# Patient Record
Sex: Female | Born: 1995 | Race: White | Hispanic: No | Smoking: Never smoker
Health system: Southern US, Community
[De-identification: ages and names within clinical notes are randomized; demographics above are authoritative.]

## PROBLEM LIST (undated history)

## (undated) DIAGNOSIS — Z87442 Personal history of urinary calculi: Secondary | ICD-10-CM

## (undated) DIAGNOSIS — N200 Calculus of kidney: Secondary | ICD-10-CM

## (undated) DIAGNOSIS — Z87898 Personal history of other specified conditions: Secondary | ICD-10-CM

## (undated) DIAGNOSIS — E038 Other specified hypothyroidism: Secondary | ICD-10-CM

## (undated) DIAGNOSIS — R4589 Other symptoms and signs involving emotional state: Secondary | ICD-10-CM

## (undated) DIAGNOSIS — F418 Other specified anxiety disorders: Secondary | ICD-10-CM

## (undated) DIAGNOSIS — R0789 Other chest pain: Secondary | ICD-10-CM

## (undated) DIAGNOSIS — M419 Scoliosis, unspecified: Secondary | ICD-10-CM

## (undated) DIAGNOSIS — G43909 Migraine, unspecified, not intractable, without status migrainosus: Secondary | ICD-10-CM

## (undated) DIAGNOSIS — M1990 Unspecified osteoarthritis, unspecified site: Secondary | ICD-10-CM

## (undated) HISTORY — DX: Other specified hypothyroidism: E03.8

## (undated) HISTORY — PX: CYST REMOVAL NECK: SHX6281

## (undated) HISTORY — DX: Other chest pain: R07.89

## (undated) HISTORY — DX: Other specified anxiety disorders: F41.8

## (undated) HISTORY — DX: Migraine, unspecified, not intractable, without status migrainosus: G43.909

## (undated) HISTORY — DX: Other symptoms and signs involving emotional state: R45.89

## (undated) HISTORY — DX: Scoliosis, unspecified: M41.9

## (undated) HISTORY — PX: MOUTH SURGERY: SHX715

## (undated) HISTORY — DX: Unspecified osteoarthritis, unspecified site: M19.90

---
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
## 2016-01-21 ENCOUNTER — Encounter: Payer: Self-pay | Admitting: Family Medicine

## 2016-01-21 ENCOUNTER — Ambulatory Visit (INDEPENDENT_AMBULATORY_CARE_PROVIDER_SITE_OTHER): Payer: BLUE CROSS/BLUE SHIELD | Admitting: Family Medicine

## 2016-01-21 VITALS — BP 124/81 | HR 102 | Temp 99.1°F | Ht 62.0 in | Wt 121.6 lb

## 2016-01-21 DIAGNOSIS — G43809 Other migraine, not intractable, without status migrainosus: Secondary | ICD-10-CM | POA: Diagnosis not present

## 2016-01-21 DIAGNOSIS — J302 Other seasonal allergic rhinitis: Secondary | ICD-10-CM | POA: Insufficient documentation

## 2016-01-21 DIAGNOSIS — J069 Acute upper respiratory infection, unspecified: Secondary | ICD-10-CM

## 2016-01-21 DIAGNOSIS — G43909 Migraine, unspecified, not intractable, without status migrainosus: Secondary | ICD-10-CM | POA: Insufficient documentation

## 2016-01-21 MED ORDER — FLUTICASONE PROPIONATE 50 MCG/ACT NA SUSP: 1.0000 | Freq: Two times a day (BID) | NASAL | Status: DC | PRN

## 2016-01-21 MED ORDER — NAPROXEN 500 MG PO TABS: 500.0000 mg | ORAL_TABLET | Freq: Two times a day (BID) | ORAL | Status: DC

## 2016-01-21 MED ORDER — AMOXICILLIN 500 MG PO CAPS: 500.0000 mg | ORAL_CAPSULE | Freq: Two times a day (BID) | ORAL | Status: DC

## 2016-01-21 NOTE — Progress Notes (Signed)
BP 124/81 mmHg  Pulse 102  Temp(Src) 99.1 F (37.3 C) (Oral)  Ht  (1.575 m)  Wt 121 lb 9.6 oz (55.157 kg)  BMI 22.24 kg/m2  LMP 01/21/2016 (Exact Date)   Subjective:    Patient ID: Tami Santos, female    DOB: 1996/09/08, 20 y.o.   MRN: [id number]  HPI: Tami Santos is a 19 y.o. female presenting on 01/21/2016 for Bilateral ear pain x 1 week; Sinus Congestion; and Headache   HPI Sinus congestion and ear pain Patient has been having sinus congestion and ear pain for the past week. She also gets bilateral headache since been associated with it as well. The headaches start from her frontal region radiating around to her temples. She denies any fevers or chills. The ear pain has been alternating back and forth between right side and left side. She does admit to having postnasal drainage and a cough that is nonproductive. She is also been having some nasal drainage. She has chronic seasonal allergies and uses her Flonase for this every day. She denies any shortness of breath or wheezing. She is leaving tomorrow to remove the [employer] with her new husband.  Relevant past medical, surgical, family and social history reviewed and updated as indicated. Interim medical history since our last visit reviewed. Allergies and medications reviewed and updated.  Review of Systems  Constitutional: Negative for fever and chills.  HENT: Positive for ear pain, postnasal drip, rhinorrhea, sinus pressure, sneezing and sore throat. Negative for congestion and ear discharge.   Eyes: Negative for pain, redness and visual disturbance.  Respiratory: Positive for cough. Negative for chest tightness and shortness of breath.   Cardiovascular: Negative for chest pain and leg swelling.  Genitourinary: Negative for dysuria and difficulty urinating.  Musculoskeletal: Negative for back pain and gait problem.  Skin: Negative for rash.  Neurological: Negative for light-headedness and headaches.    Psychiatric/Behavioral: Negative for behavioral problems and agitation.  All other systems reviewed and are negative.   Per HPI unless specifically indicated above Social History   Social History  . Marital Status: Married    Spouse Name: N/A  . Number of Children: N/A  . Years of Education: N/A   Social History Main Topics  . Smoking status: Never Smoker   . Smokeless tobacco: Never Used  . Alcohol Use: Yes     Comment: occasionally  . Drug Use: No  . Sexual Activity: Yes    Birth Control/ Protection: Condom   Other Topics Concern  . None   Social History Narrative  . None   Past Surgical History  Procedure Laterality Date  . Cyst removal neck         Medication List       This list is accurate as of: 01/21/16 11:36 AM.  Always use your most recent med list.               amoxicillin 500 MG capsule  Commonly known as:  AMOXIL  Take 1 capsule (500 mg total) by mouth 2 (two) times daily.     fluticasone 50 MCG/ACT nasal spray  Commonly known as:  FLONASE  Place 1 spray into both nostrils 2 (two) times daily as needed for allergies or rhinitis.     naproxen 500 MG tablet  Commonly known as:  NAPROSYN  Take 1 tablet (500 mg total) by mouth 2 (two) times daily with a meal.           Objective:  BP 124/81 mmHg  Pulse 102  Temp(Src) 99.1 F (37.3 C) (Oral)  Ht  (1.575 m)  Wt 121 lb 9.6 oz (55.157 kg)  BMI 22.24 kg/m2  LMP 01/21/2016 (Exact Date)  Wt Readings from Last 3 Encounters:  01/21/16 121 lb 9.6 oz (55.157 kg) (43 %*, Z = -0.18)   * Growth percentiles are based on CDC 2-20 Years data.    Physical Exam  Constitutional: She is oriented to person, place, and time. She appears well-developed and well-nourished. No distress.  HENT:  Right Ear: External ear and ear canal normal. No mastoid tenderness. Tympanic membrane is retracted. Tympanic membrane is not perforated and not erythematous. No middle ear effusion.  Left Ear: External ear  and ear canal normal. No mastoid tenderness. Tympanic membrane is retracted. Tympanic membrane is not perforated and not erythematous.  No middle ear effusion.  Nose: Mucosal edema and rhinorrhea present. No epistaxis. Right sinus exhibits no maxillary sinus tenderness and no frontal sinus tenderness. Left sinus exhibits no maxillary sinus tenderness and no frontal sinus tenderness.  Mouth/Throat: Uvula is midline and mucous membranes are normal. Posterior oropharyngeal edema and posterior oropharyngeal erythema present. No oropharyngeal exudate or tonsillar abscesses.  Eyes: Conjunctivae and EOM are normal.  Cardiovascular: Normal rate, regular rhythm, normal heart sounds and intact distal pulses.   No murmur heard. Pulmonary/Chest: Effort normal and breath sounds normal. No respiratory distress. She has no wheezes.  Musculoskeletal: Normal range of motion. She exhibits no edema or tenderness.  Neurological: She is alert and oriented to person, place, and time. Coordination normal.  Skin: Skin is warm and dry. No rash noted. She is not diaphoretic.  Psychiatric: She has a normal mood and affect. Her behavior is normal.  Vitals reviewed.   No results found for this or any previous visit.    Assessment & Plan:       Problem List Items Addressed This Visit      Cardiovascular and Mediastinum   Migraines - Primary   Relevant Medications   naproxen (NAPROSYN) 500 MG tablet    Other Visit Diagnoses    Acute upper respiratory infection        Relevant Medications    naproxen (NAPROSYN) 500 MG tablet    amoxicillin (AMOXIL) 500 MG capsule        Follow up plan: Return if symptoms worsen or fail to improve.  Counseling provided for all of the vaccine components No orders of the defined types were placed in this encounter.    Arville Care, MD [facility] 01/21/2016, 11:36 AM

## 2017-02-08 ENCOUNTER — Telehealth: Payer: Self-pay | Admitting: Family Medicine

## 2017-02-08 ENCOUNTER — Ambulatory Visit (INDEPENDENT_AMBULATORY_CARE_PROVIDER_SITE_OTHER): Admitting: Family Medicine

## 2017-02-08 ENCOUNTER — Encounter: Payer: Self-pay | Admitting: Family Medicine

## 2017-02-08 VITALS — BP 114/74 | HR 81 | Temp 98.8°F | Ht 62.0 in | Wt 141.1 lb

## 2017-02-08 DIAGNOSIS — N200 Calculus of kidney: Secondary | ICD-10-CM | POA: Diagnosis not present

## 2017-02-08 NOTE — Addendum Note (Signed)
Addended by: Arville CareETTINGER, Yianni Skilling on: 02/08/2017 03:45 PM   Modules accepted: Orders

## 2017-02-08 NOTE — Progress Notes (Addendum)
BP 114/74   Pulse 81   Temp 98.8 F (37.1 C) (Oral)   Ht 5\' 2"  (1.575 m)   Wt 141 lb 2 oz (64 kg)   BMI 25.81 kg/m    Subjective:    Patient ID: Tami Santos, female    DOB: 1996-03-30, 21 y.o.   MRN: [id number]  HPI: Tami Santos is a 21 y.o. female presenting on 02/08/2017 for Referral to nephrologist (has been to ER twice recently with kidney stones; just passed kidney stone yesterday)   HPI Renal stones Patient is coming in today for recurrent renal stones. She has had 2 kidney stones that she is passed in the past couple weeks. [facility] for both and had CT scan both times. We do not have the CT scans from the hospital but she said she did pass a smaller stone but then collected the second stone and brought it with her today. Currently today she is in no pain and having no hematuria or dysuria. She has been trying to stay well hydrated. She does say that she had a vaginal delivery of her son 2 months ago and also had an IUD placed and does not know if it could be correlated to her getting kidney stones from those. She denies any fevers or chills or flank pain or abdominal pain today.  Relevant past medical, surgical, family and social history reviewed and updated as indicated. Interim medical history since our last visit reviewed. Allergies and medications reviewed and updated.  Review of Systems  Constitutional: Negative for chills and fever.  Respiratory: Negative for chest tightness and shortness of breath.   Cardiovascular: Negative for chest pain and leg swelling.  Gastrointestinal: Negative for abdominal pain, blood in stool and constipation.  Genitourinary: Negative for decreased urine volume, difficulty urinating, dysuria, frequency, hematuria, urgency, vaginal bleeding, vaginal discharge and vaginal pain.  Musculoskeletal: Negative for back pain and gait problem.  Skin: Negative for rash.  Neurological: Negative for  light-headedness and headaches.  Psychiatric/Behavioral: Negative for agitation and behavioral problems.  All other systems reviewed and are negative.   Per HPI unless specifically indicated above      Objective:    BP 114/74   Pulse 81   Temp 98.8 F (37.1 C) (Oral)   Ht 5\' 2"  (1.575 m)   Wt 141 lb 2 oz (64 kg)   BMI 25.81 kg/m   Wt Readings from Last 3 Encounters:  02/08/17 141 lb 2 oz (64 kg) (71 %, Z= 0.56)*  01/21/16 121 lb 9.6 oz (55.2 kg) (43 %, Z= -0.18)*   * Growth percentiles are based on CDC 2-20 Years data.    Physical Exam  Constitutional: She is oriented to person, place, and time. She appears well-developed and well-nourished. No distress.  Eyes: Conjunctivae are normal.  Cardiovascular: Normal rate, regular rhythm, normal heart sounds and intact distal pulses.   No murmur heard. Pulmonary/Chest: Effort normal and breath sounds normal. No respiratory distress. She has no wheezes.  Abdominal: Soft. Bowel sounds are normal. She exhibits no distension. There is no hepatosplenomegaly. There is no tenderness. There is no rigidity, no rebound, no guarding and no CVA tenderness.  Musculoskeletal: Normal range of motion. She exhibits no edema.  Neurological: She is alert and oriented to person, place, and time. Coordination normal.  Skin: Skin is warm and dry. No rash noted. She is not diaphoretic.  Psychiatric: She has a normal mood and affect. Her behavior  is normal.  Nursing note and vitals reviewed.   No results found for this or any previous visit.    Assessment & Plan:   Problem List Items Addressed This Visit    None    Visit Diagnoses    Renal stones    -  Primary   Relevant Medications   oxyCODONE-acetaminophen (PERCOCET) 10-325 MG tablet   Other Relevant Orders   Stone analysis   Ambulatory referral to Urology   Nephrolithiasis       We found scans from the hospital and one of them was a 5 mm stone that was obstructing, will refer to urology    Relevant Medications   oxyCODONE-acetaminophen (PERCOCET) 10-325 MG tablet   Other Relevant Orders   Ambulatory referral to Urology       Follow up plan: Return if symptoms worsen or fail to improve.  Counseling provided for all of the vaccine components Orders Placed This Encounter  Procedures  . Stone analysis    Arville Care, MD [facility] 02/08/2017, 2:10 PM

## 2017-02-08 NOTE — Telephone Encounter (Signed)
Attempted to call patient to inform her that the CT [facility] a [facility], I put in the referral to urology but was unable to call her and her mobile phone did not have a voice messaging system set up. (Routing comment -per Dettinger   I have reached the pt and she is aware of the referral  - JHBullins, LPN

## 2017-02-15 ENCOUNTER — Telehealth: Payer: Self-pay

## 2017-02-15 ENCOUNTER — Other Ambulatory Visit: Payer: Self-pay | Admitting: Urology

## 2017-02-15 NOTE — Telephone Encounter (Signed)
April in the lab checked with [facility] and they are still processing the stone

## 2017-02-15 NOTE — Telephone Encounter (Signed)
-----   Message from Nils PyleJoshua A Dettinger, MD sent at 02/13/2017 12:13 PM EST ----- Can recheck on the kidney stone, I did not know why it comes back is overdue and not processed.  ----- Message ----- From: SYSTEM Sent: 02/13/2017  12:05 AM To: Elige RadonJoshua A Dettinger, MD

## 2017-02-17 LAB — STONE ANALYSIS
CA OXALATE, DIHYDRATE: 2 %
CA PHOS CRY STONE QL IR: 80 %
Ca Oxalate,Monohydr.: 18 %
STONE WEIGHT: 25.8 mg

## 2017-02-17 LAB — PLEASE NOTE

## 2017-02-21 ENCOUNTER — Telehealth: Payer: Self-pay | Admitting: Family Medicine

## 2017-02-21 ENCOUNTER — Encounter (HOSPITAL_BASED_OUTPATIENT_CLINIC_OR_DEPARTMENT_OTHER): Payer: Self-pay | Admitting: *Deleted

## 2017-02-21 NOTE — Telephone Encounter (Signed)
Pt aware.

## 2017-02-21 NOTE — Progress Notes (Signed)
NPO AFTER MN WITH EXCEPTION CLEAR LIQUIDS UNTIL 0930 (NO CREAM/ MILK PRODUCTS).  ARRIVE AT 1345.  NEEDS HG AND URINE PREG.  MAY TAKE PERCOCET OR VICODIN IF NEEDED AM DOS W/ SIPS OF WATER.

## 2017-02-27 ENCOUNTER — Encounter (HOSPITAL_BASED_OUTPATIENT_CLINIC_OR_DEPARTMENT_OTHER)
Admission: RE | Disposition: A | Discharge status: Home / Self Care | Payer: Self-pay | Source: Ambulatory Visit | Attending: Urology

## 2017-02-27 ENCOUNTER — Ambulatory Visit (HOSPITAL_BASED_OUTPATIENT_CLINIC_OR_DEPARTMENT_OTHER): Admitting: Anesthesiology

## 2017-02-27 ENCOUNTER — Encounter (HOSPITAL_BASED_OUTPATIENT_CLINIC_OR_DEPARTMENT_OTHER): Payer: Self-pay | Admitting: *Deleted

## 2017-02-27 ENCOUNTER — Ambulatory Visit (HOSPITAL_BASED_OUTPATIENT_CLINIC_OR_DEPARTMENT_OTHER)
Admission: RE | Admit: 2017-02-27 | Discharge: 2017-02-27 | Disposition: A | Discharge status: Home / Self Care | Source: Ambulatory Visit | Attending: Urology | Admitting: Urology

## 2017-02-27 DIAGNOSIS — N202 Calculus of kidney with calculus of ureter: Secondary | ICD-10-CM | POA: Insufficient documentation

## 2017-02-27 DIAGNOSIS — R109 Unspecified abdominal pain: Secondary | ICD-10-CM | POA: Diagnosis present

## 2017-02-27 HISTORY — DX: Calculus of kidney: N20.0

## 2017-02-27 HISTORY — DX: Personal history of urinary calculi: Z87.442

## 2017-02-27 HISTORY — DX: Personal history of other specified conditions: Z87.898

## 2017-02-27 HISTORY — PX: CYSTOSCOPY WITH RETROGRADE PYELOGRAM, URETEROSCOPY AND STENT PLACEMENT: SHX5789

## 2017-02-27 LAB — POCT PREGNANCY, URINE: Preg Test, Ur: NEGATIVE

## 2017-02-27 LAB — POCT HEMOGLOBIN-HEMACUE: HEMOGLOBIN: 13.8 g/dL (ref 12.0–15.0)

## 2017-02-27 SURGERY — CYSTOURETEROSCOPY, WITH RETROGRADE PYELOGRAM AND STENT INSERTION
Anesthesia: General | Site: Renal | Laterality: Bilateral

## 2017-02-27 MED ORDER — LIDOCAINE 2% (20 MG/ML) 5 ML SYRINGE
INTRAMUSCULAR | Status: DC | PRN
  Administered 2017-02-27: 60 mg via INTRAVENOUS

## 2017-02-27 MED ORDER — IOHEXOL 300 MG/ML  SOLN
INTRAMUSCULAR | Status: DC | PRN
  Administered 2017-02-27: 8 mL

## 2017-02-27 MED ORDER — ONDANSETRON HCL 4 MG/2ML IJ SOLN
INTRAMUSCULAR | Status: AC
  Filled 2017-02-27: qty 2

## 2017-02-27 MED ORDER — OXYCODONE HCL 5 MG/5ML PO SOLN
5.0000 mg | Freq: Once | ORAL | Status: AC | PRN
  Filled 2017-02-27: qty 5

## 2017-02-27 MED ORDER — METOCLOPRAMIDE HCL 5 MG/ML IJ SOLN
INTRAMUSCULAR | Status: DC | PRN
  Administered 2017-02-27: 10 mg via INTRAVENOUS

## 2017-02-27 MED ORDER — FENTANYL CITRATE (PF) 100 MCG/2ML IJ SOLN
INTRAMUSCULAR | Status: DC | PRN
  Administered 2017-02-27 (×4): 25 ug via INTRAVENOUS

## 2017-02-27 MED ORDER — PROPOFOL 10 MG/ML IV BOLUS
INTRAVENOUS | Status: AC
  Filled 2017-02-27: qty 40

## 2017-02-27 MED ORDER — DEXAMETHASONE SODIUM PHOSPHATE 4 MG/ML IJ SOLN
INTRAMUSCULAR | Status: DC | PRN
  Administered 2017-02-27: 10 mg via INTRAVENOUS

## 2017-02-27 MED ORDER — SODIUM CHLORIDE 0.9 % IR SOLN
Status: DC | PRN
  Administered 2017-02-27: 3000 mL via INTRAVESICAL
  Administered 2017-02-27: 1000 mL via INTRAVESICAL

## 2017-02-27 MED ORDER — LACTATED RINGERS IV SOLN
INTRAVENOUS | Status: DC
Start: 2017-02-27 — End: 2017-02-27
  Administered 2017-02-27 (×2): via INTRAVENOUS
  Filled 2017-02-27: qty 1000

## 2017-02-27 MED ORDER — CEFAZOLIN SODIUM-DEXTROSE 2-4 GM/100ML-% IV SOLN
INTRAVENOUS | Status: AC
  Filled 2017-02-27: qty 100

## 2017-02-27 MED ORDER — MEPERIDINE HCL 25 MG/ML IJ SOLN
6.2500 mg | INTRAMUSCULAR | Status: DC | PRN
  Filled 2017-02-27: qty 1

## 2017-02-27 MED ORDER — PROPOFOL 10 MG/ML IV BOLUS
INTRAVENOUS | Status: DC | PRN
  Administered 2017-02-27 (×2): 200 mg via INTRAVENOUS

## 2017-02-27 MED ORDER — OXYCODONE HCL 5 MG PO TABS
5.0000 mg | ORAL_TABLET | Freq: Once | ORAL | Status: AC | PRN
  Administered 2017-02-27: 5 mg via ORAL
  Filled 2017-02-27: qty 1

## 2017-02-27 MED ORDER — OXYCODONE-ACETAMINOPHEN 10-325 MG PO TABS: 1.0000 | ORAL_TABLET | Freq: Four times a day (QID) | ORAL | 0 refills | Status: DC | PRN

## 2017-02-27 MED ORDER — DEXAMETHASONE SODIUM PHOSPHATE 10 MG/ML IJ SOLN
INTRAMUSCULAR | Status: AC
  Filled 2017-02-27: qty 1

## 2017-02-27 MED ORDER — METOCLOPRAMIDE HCL 5 MG/ML IJ SOLN
INTRAMUSCULAR | Status: AC
  Filled 2017-02-27: qty 2

## 2017-02-27 MED ORDER — OXYCODONE HCL 5 MG PO TABS
ORAL_TABLET | ORAL | Status: AC
  Filled 2017-02-27: qty 1

## 2017-02-27 MED ORDER — FENTANYL CITRATE (PF) 100 MCG/2ML IJ SOLN
25.0000 ug | INTRAMUSCULAR | Status: DC | PRN
  Filled 2017-02-27: qty 1

## 2017-02-27 MED ORDER — LACTATED RINGERS IV SOLN
INTRAVENOUS | Status: DC
  Filled 2017-02-27: qty 1000

## 2017-02-27 MED ORDER — MIDAZOLAM HCL 5 MG/5ML IJ SOLN
INTRAMUSCULAR | Status: DC | PRN
  Administered 2017-02-27: 2 mg via INTRAVENOUS

## 2017-02-27 MED ORDER — ONDANSETRON HCL 4 MG/2ML IJ SOLN
INTRAMUSCULAR | Status: DC | PRN
  Administered 2017-02-27: 4 mg via INTRAVENOUS

## 2017-02-27 MED ORDER — FENTANYL CITRATE (PF) 100 MCG/2ML IJ SOLN
INTRAMUSCULAR | Status: AC
  Filled 2017-02-27: qty 2

## 2017-02-27 MED ORDER — CEFAZOLIN SODIUM-DEXTROSE 2-4 GM/100ML-% IV SOLN
2.0000 g | INTRAVENOUS | Status: AC
  Administered 2017-02-27: 2 g via INTRAVENOUS
  Filled 2017-02-27: qty 100

## 2017-02-27 MED ORDER — PROMETHAZINE HCL 25 MG/ML IJ SOLN
6.2500 mg | INTRAMUSCULAR | Status: DC | PRN
  Filled 2017-02-27: qty 1

## 2017-02-27 MED ORDER — MIDAZOLAM HCL 2 MG/2ML IJ SOLN
INTRAMUSCULAR | Status: AC
  Filled 2017-02-27: qty 2

## 2017-02-27 MED ORDER — TAMSULOSIN HCL 0.4 MG PO CAPS: 0.4000 mg | ORAL_CAPSULE | ORAL | 0 refills | Status: DC | PRN

## 2017-02-27 MED ORDER — CEFAZOLIN IN D5W 1 GM/50ML IV SOLN
1.0000 g | INTRAVENOUS | Status: DC
  Filled 2017-02-27: qty 50

## 2017-02-27 SURGICAL SUPPLY — 18 items
BAG DRAIN URO-CYSTO SKYTR STRL (DRAIN) ×4 IMPLANT
BASKET STONE 1.7 NGAGE (UROLOGICAL SUPPLIES) ×4 IMPLANT
CATH INTERMIT  6FR 70CM (CATHETERS) ×4 IMPLANT
CLOTH BEACON ORANGE TIMEOUT ST (SAFETY) ×4 IMPLANT
GLOVE BIO SURGEON STRL SZ8 (GLOVE) ×4 IMPLANT
GOWN STRL REUS W/TWL LRG LVL3 (GOWN DISPOSABLE) ×4 IMPLANT
GOWN STRL REUS W/TWL XL LVL3 (GOWN DISPOSABLE) ×4 IMPLANT
GUIDEWIRE ANG ZIPWIRE 038X150 (WIRE) ×4 IMPLANT
GUIDEWIRE STR DUAL SENSOR (WIRE) ×4 IMPLANT
IV NS IRRIG 3000ML ARTHROMATIC (IV SOLUTION) ×4 IMPLANT
KIT RM TURNOVER CYSTO AR (KITS) ×4 IMPLANT
MANIFOLD NEPTUNE II (INSTRUMENTS) ×4 IMPLANT
PACK CYSTO (CUSTOM PROCEDURE TRAY) ×4 IMPLANT
SHEATH ACCESS URETERAL 38CM (SHEATH) ×4 IMPLANT
STENT URET 6FRX24 CONTOUR (STENTS) ×8 IMPLANT
SYRINGE 10CC LL (SYRINGE) ×4 IMPLANT
TUBE CONNECTING 12'X1/4 (SUCTIONS) ×1
TUBE CONNECTING 12X1/4 (SUCTIONS) ×3 IMPLANT

## 2017-02-27 NOTE — Discharge Instructions (Signed)
°Post Anesthesia Home Care Instructions ° °Activity: °Get plenty of rest for the remainder of the day. A responsible individual must stay with you for 24 hours following the procedure.  °For the next 24 hours, DO NOT: °-Drive a car °-Operate machinery °-Drink alcoholic beverages °-Take any medication unless instructed by your physician °-Make any legal decisions or sign important papers. ° °Meals: °Start with liquid foods such as gelatin or soup. Progress to regular foods as tolerated. Avoid greasy, spicy, heavy foods. If nausea and/or vomiting occur, drink only clear liquids until the nausea and/or vomiting subsides. Call your physician if vomiting continues. ° °Special Instructions/Symptoms: °Your throat may feel dry or sore from the anesthesia or the breathing tube placed in your throat during surgery. If this causes discomfort, gargle with warm salt water. The discomfort should disappear within 24 hours. ° °If you had a scopolamine patch placed behind your ear for the management of post- operative nausea and/or vomiting: ° °1. The medication in the patch is effective for 72 hours, after which it should be removed.  Wrap patch in a tissue and discard in the trash. Wash hands thoroughly with soap and water. °2. You may remove the patch earlier than 72 hours if you experience unpleasant side effects which may include dry mouth, dizziness or visual disturbances. °3. Avoid touching the patch. Wash your hands with soap and water after contact with the patch. °  °Ureteral Stent Implantation, Care After °Refer to this sheet in the next few weeks. These instructions provide you with information about caring for yourself after your procedure. Your health care provider may also give you more specific instructions. Your treatment has been planned according to current medical practices, but problems sometimes occur. Call your health care provider if you have any problems or questions after your procedure. °What can I expect  after the procedure? °After the procedure, it is common to have: °· Nausea. °· Mild pain when you urinate. You may feel this pain in your lower back or lower abdomen. Pain should stop within a few minutes after you urinate. This may last for up to 1 week. °· A small amount of blood in your urine for several days. °Follow these instructions at home: °  °Medicines  °· Take over-the-counter and prescription medicines only as told by your health care provider. °· If you were prescribed an antibiotic medicine, take it as told by your health care provider. Do not stop taking the antibiotic even if you start to feel better. °· Do not drive for 24 hours if you received a sedative. °· Do not drive or operate heavy machinery while taking prescription pain medicines. °Activity  °· Return to your normal activities as told by your health care provider. Ask your health care provider what activities are safe for you. °· Do not lift anything that is heavier than 10 lb (4.5 kg). Follow this limit for 1 week after your procedure, or for as long as told by your health care provider. °General instructions  °· Watch for any blood in your urine. Call your health care provider if the amount of blood in your urine increases. °· If you have a catheter: °¨ Follow instructions from your health care provider about taking care of your catheter and collection bag. °¨ Do not take baths, swim, or use a hot tub until your health care provider approves. °· Drink enough fluid to keep your urine clear or pale yellow. °· Keep all follow-up visits as told by your   health care provider. This is important. °Contact a health care provider if: °· You have pain that gets worse or does not get better with medicine, especially pain when you urinate. °· You have difficulty urinating. °· You feel nauseous or you vomit repeatedly during a period of more than 2 days after the procedure. °Get help right away if: °· Your urine is dark red or has blood clots in  it. °· You are leaking urine (have incontinence). °· The end of the stent comes out of your urethra. °· You cannot urinate. °· You have sudden, sharp, or severe pain in your abdomen or lower back. °· You have a fever. °This information is not intended to replace advice given to you by your health care provider. Make sure you discuss any questions you have with your health care provider. °Document Released: 07/31/2013 Document Revised: 05/05/2016 Document Reviewed: 06/12/2015 °Elsevier Interactive Patient Education © 2017 [employer] ° °

## 2017-02-27 NOTE — Transfer of Care (Signed)
  Last Vitals:  Vitals:   02/27/17 1312 02/27/17 1818  BP: 115/70   Pulse: 75 75  Resp: 16 16  Temp: 36.7 C 36.9 C    Last Pain:  Vitals:   02/27/17 1312  TempSrc: Oral      Patients Stated Pain Goal: 4 (02/27/17 1333)  Immediate Anesthesia Transfer of Care Note  Patient: Tami Santos  Procedure(s) Performed: Procedure(s) (LRB): CYSTOSCOPY WITH RETROGRADE PYELOGRAM, URETEROSCOPY, STONE BASKETRY AND STENT PLACEMENT (Bilateral)  Patient Location: PACU  Anesthesia Type: General  Level of Consciousness: awake, alert  and oriented  Airway & Oxygen Therapy: Patient Spontanous Breathing and Patient connected to face mask oxygen  Post-op Assessment: Report given to PACU RN and Post -op Vital signs reviewed and stable  Post vital signs: Reviewed and stable  Complications: No apparent anesthesia complications

## 2017-02-27 NOTE — Anesthesia Preprocedure Evaluation (Addendum)
Anesthesia Evaluation  Patient identified by MRN, date of birth, ID band Patient awake    Reviewed: Allergy & Precautions, NPO status , Patient's Chart, lab work & pertinent test results  Airway Mallampati: I  TM Distance: >3 FB Neck ROM: Full    Dental  (+) Teeth Intact, Dental Advisory Given   Pulmonary    breath sounds clear to auscultation       Cardiovascular negative cardio ROS   Rhythm:Regular Rate:Normal     Neuro/Psych  Headaches, negative psych ROS   GI/Hepatic negative GI ROS, Neg liver ROS,   Endo/Other  negative endocrine ROS  Renal/GU Renal disease  negative genitourinary   Musculoskeletal negative musculoskeletal ROS (+)   Abdominal   Peds negative pediatric ROS (+)  Hematology negative hematology ROS (+)   Anesthesia Other Findings   Reproductive/Obstetrics negative OB ROS                            Anesthesia Physical Anesthesia Plan  ASA: II  Anesthesia Plan: General   Post-op Pain Management:    Induction: Intravenous  Airway Management Planned: LMA  Additional Equipment:   Intra-op Plan:   Post-operative Plan: Extubation in OR  Informed Consent: I have reviewed the patients History and Physical, chart, labs and discussed the procedure including the risks, benefits and alternatives for the proposed anesthesia with the patient or authorized representative who has indicated his/her understanding and acceptance.   Dental advisory given  Plan Discussed with: CRNA  Anesthesia Plan Comments: (Discussed risk of injury secondary to piercings (unable to remove per patient) and IUD in detail. )       Anesthesia Quick Evaluation

## 2017-02-27 NOTE — Anesthesia Procedure Notes (Signed)
Procedure Name: LMA Insertion Date/Time: 02/27/2017 5:24 PM Performed by: Marcene DuosFITZGERALD, ROBERT Pre-anesthesia Checklist: Patient identified, Emergency Drugs available, Suction available and Patient being monitored Patient Re-evaluated:Patient Re-evaluated prior to inductionOxygen Delivery Method: Circle system utilized Preoxygenation: Pre-oxygenation with 100% oxygen Intubation Type: IV induction Ventilation: Mask ventilation without difficulty LMA: LMA inserted LMA Size: 4.0 Number of attempts: 1 Airway Equipment and Method: Bite block Placement Confirmation: positive ETCO2 Tube secured with: Tape Dental Injury: Teeth and Oropharynx as per pre-operative assessment

## 2017-02-27 NOTE — Brief Op Note (Signed)
02/27/2017  6:03 PM  PATIENT:  Tami Santos  21 y.o. female  PRE-OPERATIVE DIAGNOSIS:  BILATERAL RENAL CALCULI  POST-OPERATIVE DIAGNOSIS:  BILATERAL RENAL CALCULI  PROCEDURE:  Procedure(s): CYSTOSCOPY WITH RETROGRADE PYELOGRAM, URETEROSCOPY AND STENT PLACEMENT (Bilateral) HOLMIUM LASER APPLICATION (Bilateral)  SURGEON:  Surgeon(s) and Role:    * Malen GauzePatrick L Benigna Delisi, MD - Primary  PHYSICIAN ASSISTANT:   ASSISTANTS: none   ANESTHESIA:   general  EBL:  Total I/O In: 1000 [I.V.:1000] Out: 10 [Blood:10]  BLOOD ADMINISTERED:none  DRAINS: bilateral 6x24 JJ ureteral stents   LOCAL MEDICATIONS USED:  NONE  SPECIMEN:  Source of Specimen:  bilateral renal calculi  DISPOSITION OF SPECIMEN:  N/A  COUNTS:  YES  TOURNIQUET:  * No tourniquets in log *  DICTATION: .Note written in EPIC  PLAN OF CARE: Discharge to home after PACU  PATIENT DISPOSITION:  PACU - hemodynamically stable.   Delay start of Pharmacological VTE agent (>24hrs) due to surgical blood loss or risk of bleeding: not applicable

## 2017-02-27 NOTE — H&P (Signed)
Urology Admission H&P  Chief Complaint: left flank pain  History of Present Illness: Tami Santos is a 20yo with a hx of bilateral renal calculi and left flank pain.   Past Medical History:  Diagnosis Date  . History of cardiac murmur as a child   . History of kidney stones   . Migraines   . Renal calculi    BILATERAL  . Scoliosis    Past Surgical History:  Procedure Laterality Date  . CYST REMOVAL NECK  child   neck/chest area    Home Medications:  Prescriptions Prior to Admission  Medication Sig Dispense Refill Last Dose  . HYDROcodone-acetaminophen (NORCO/VICODIN) 5-325 MG tablet Take 1 tablet by mouth every 6 (six) hours as needed for moderate pain.   Past Month at Unknown time  . ketorolac (TORADOL) 10 MG tablet Take 10 mg by mouth every 6 (six) hours as needed.   Past Month at Unknown time  . loratadine (CLARITIN) 10 MG tablet Take 10 mg by mouth every evening.    02/26/2017 at Unknown time  . oxyCODONE-acetaminophen (PERCOCET) 10-325 MG tablet Take 1 tablet by mouth every 6 (six) hours as needed for pain.   Past Week at Unknown time  . Prenatal Vit-Fe Fumarate-FA (MULTIVITAMIN-PRENATAL) 27-0.8 MG TABS tablet Take 1 tablet by mouth daily at 12 noon.   02/26/2017 at Unknown time  . tamsulosin (FLOMAX) 0.4 MG CAPS capsule Take 0.4 mg by mouth as needed.   Past Month at Unknown time  . ondansetron (ZOFRAN) 4 MG tablet Take 4 mg by mouth every 8 (eight) hours as needed for nausea or vomiting.   Unknown at Unknown time   Allergies: No Known Allergies  Family History  Problem Relation Age of Onset  . Thyroid disease Father   . Hyperlipidemia Father    Social History:  reports that she has never smoked. She has never used smokeless tobacco. She reports that she drinks alcohol. She reports that she does not use drugs.  Review of Systems  All other systems reviewed and are negative.   Physical Exam:  Vital signs in last 24 hours: Temp:  [98.1 F (36.7 C)] 98.1 F (36.7 C)  (03/19 1312) Pulse Rate:  [75] 75 (03/19 1312) Resp:  [16] 16 (03/19 1312) BP: (115)/(70) 115/70 (03/19 1312) SpO2:  [100 %] 100 % (03/19 1312) Weight:  [61.7 kg (136 lb)] 61.7 kg (136 lb) (03/19 1312) Physical Exam  Constitutional: She is oriented to person, place, and time. She appears well-developed and well-nourished.  HENT:  Head: Normocephalic and atraumatic.  Eyes: EOM are normal. Pupils are equal, round, and reactive to light.  Neck: Normal range of motion. No thyromegaly present.  Cardiovascular: Normal rate and regular rhythm.   Respiratory: Effort normal. No respiratory distress.  GI: Soft. She exhibits no distension.  Musculoskeletal: Normal range of motion. She exhibits no edema.  Neurological: She is alert and oriented to person, place, and time.  Skin: Skin is warm and dry.  Psychiatric: She has a normal mood and affect. Her behavior is normal. Judgment and thought content normal.    Laboratory Data:  Results for orders placed or performed during the hospital encounter of 02/27/17 (from the past 24 hour(s))  Pregnancy, urine POC     Status: None   Collection Time: 02/27/17  1:31 PM  Result Value Ref Range   Preg Test, Ur NEGATIVE NEGATIVE  Hemoglobin-hemacue, POC     Status: None   Collection Time: 02/27/17  1:37 PM  Result Value Ref Range   Hemoglobin 13.8 12.0 - 15.0 g/dL   No results found for this or any previous visit (from the past 240 hour(s)). Creatinine: No results for input(s): CREATININE in the last 168 hours. Baseline Creatinine: unknwon  Impression/Assessment:  20yo with bilateral renal calculi  Plan:  The risks/benefits/alternatives to bilateral renal stone extraction was explained to the patient and they understand and wish to proceed with surgery  Wilkie Aye 02/27/2017, 4:41 PM

## 2017-02-27 NOTE — Anesthesia Postprocedure Evaluation (Signed)
Anesthesia Post Note  Patient: Industrial/product designerCheyanne H Santos  Procedure(s) Performed: Procedure(s) (LRB): CYSTOSCOPY WITH RETROGRADE PYELOGRAM, URETEROSCOPY, STONE BASKETRY AND STENT PLACEMENT (Bilateral)  Patient location during evaluation: PACU Anesthesia Type: General Level of consciousness: awake and alert Pain management: pain level controlled Vital Signs Assessment: post-procedure vital signs reviewed and stable Respiratory status: spontaneous breathing, nonlabored ventilation, respiratory function stable and patient connected to nasal cannula oxygen Cardiovascular status: blood pressure returned to baseline and stable Postop Assessment: no signs of nausea or vomiting Anesthetic complications: no       Last Vitals:  Vitals:   02/27/17 1818 02/27/17 1830  BP: 115/75 116/81  Pulse: 75 81  Resp: 16 14  Temp: 36.9 C     Last Pain:  Vitals:   02/27/17 1830  TempSrc:   PainSc: 6                  Kennieth RadFitzgerald, [name]

## 2017-02-28 ENCOUNTER — Encounter (HOSPITAL_BASED_OUTPATIENT_CLINIC_OR_DEPARTMENT_OTHER): Payer: Self-pay | Admitting: Urology

## 2017-03-07 NOTE — Op Note (Signed)
Marland Kitchen.Preoperative diagnosis: bilateral renal calculi  Postoperative diagnosis: Same  Procedure: 1 cystoscopy 2. bilateralretrograde pyelography 3.  Intraoperative fluoroscopy, under one hour, with interpretation 4.  Bilateral ureteroscopic stone manipulation with basket extraction 5.  bilateral 6 x 24 JJ stent placement  Attending: Cleda MccreedyPatrick Mackenzie  Anesthesia: General  Estimated blood loss: None  Drains: bilateral 6 x 26 JJ ureteral stent with tether  Specimens: stone for analysis  Antibiotics: ancef  Findings: bilateral mid and lower pole renal calculi. No hydronephrosis. No masses/lesions in the bladder. Ureteral orifices in normal anatomic location.  Indications: Patient is a 21 year old female with a history of bilateral renal calculi and bilateral flank pain. After discussing treatment options, they decided proceed with bilateral ureteroscopic stone manipulation.  Procedure her in detail: The patient was brought to the operating room and a brief timeout was done to ensure correct patient, correct procedure, correct site.  General anesthesia was administered patient was placed in dorsal lithotomy position.  Her genitalia was then prepped and draped in usual sterile fashion.  A rigid 22 French cystoscope was passed in the urethra and the bladder.  Bladder was inspected free masses or lesions.  the ureteral orifices were in the normal orthotopic locations. a 6 french ureteral catheter was then instilled into the left ureteral orifice.  a gentle retrograde was obtained and findings noted above. We then advanced a zipwire through the catheter and up to the renal pelvis.  we then removed the cystoscope and cannulated the left ureteral orifice with a semirigid ureteroscope.  We located no stone in the ureter. We then placed a sensor wire up to the renal pelvis. We removed the scope and advanced a 12/14 x 38cm access sheath up to the renal pelvis. We then used the flexible ureteroscope to  perform nephroscopy. We located calculi int he mid and lower poles which were removed with an NGage basket. Once the stone were removed we then removed the access sheath under direct vision and noted to injury to the ureter.  We then placed a 6 x 24 double-j ureteral stent over the original zip wire. We then removed the wire and good coil was noted in the the renal pelvis under fluoroscopy and the bladder under direct vision.  We then turned out attention to the right side. We then advanced a zipwire through the catheter and up to the renal pelvis.  we then removed the cystoscope and cannulated the left ureteral orifice with a semirigid ureteroscope.  We located no stone in the ureter. We then placed a sensor wire up to the renal pelvis. We removed the scope and advanced a 12/14 x 38cm access sheath up to the renal pelvis. We then used the flexible ureteroscope to perform nephroscopy. We located calculi int he mid and lower poles which were removed with an NGage basket. Once the stone were removed we then removed the access sheath under direct vision and noted to injury to the ureter. we then placed a 6 x 24 double-j ureteral stent over the original zip wire.  We then removed the wire and good coil was noted in the the renal pelvis under fluoroscopy and the bladder under direct vision.   the bladder was then drained and this concluded the procedure which was well tolerated by patient.  Complications: None  Condition: Stable, extubated, transferred to PACU  Plan: Patient is to be discharged home as to follow-up in 2 weeks. She is to remove her stents by pulling the tether in 72 hours

## 2017-07-11 ENCOUNTER — Telehealth: Payer: Self-pay | Admitting: Family Medicine

## 2017-07-11 NOTE — Telephone Encounter (Signed)
Called to schedule appt Pt wanted appt for today Pt declined appt for tomorrow Will call back to schedule

## 2017-08-09 ENCOUNTER — Ambulatory Visit (INDEPENDENT_AMBULATORY_CARE_PROVIDER_SITE_OTHER): Payer: Medicaid Other | Admitting: Family Medicine

## 2017-08-09 ENCOUNTER — Encounter: Payer: Self-pay | Admitting: Family Medicine

## 2017-08-09 VITALS — BP 114/75 | HR 91 | Temp 99.2°F | Ht 62.0 in | Wt 134.0 lb

## 2017-08-09 DIAGNOSIS — W57XXXA Bitten or stung by nonvenomous insect and other nonvenomous arthropods, initial encounter: Secondary | ICD-10-CM

## 2017-08-09 DIAGNOSIS — S80862A Insect bite (nonvenomous), left lower leg, initial encounter: Secondary | ICD-10-CM | POA: Diagnosis not present

## 2017-08-09 NOTE — Progress Notes (Signed)
Chief Complaint  Patient presents with  . Insect Bite    pt here today c/o ?insect bite on left calf that happened Sunday    HPI  Patient presents today for Bug bite on the left posterior leg 3 days ago. Redness spreading from it. Not painful. Worried it might be a spider bite.  PMH: Smoking status noted ROS: Per HPI  Objective: BP 114/75   Pulse 91   Temp 99.2 F (37.3 C) (Oral)   Ht 5\' 2"  (1.575 m)   Wt 134 lb (60.8 kg)   BMI 24.51 kg/m  Gen: NAD, alert, cooperative with exam Skin: There is a 4 cm violaceous erythema at the upper posterior left calf. There is a central puncture without any sign of ulceration or infection.  Ext: No edema, warm Neuro: Alert and oriented, No gross deficits  Assessment and plan:  1. Insect bite of left lower leg with local reaction,bruise    Apply heat frequently to help the bruise dissipate. Notify me or the office is the area becomes more reddish than currently noted. Also if it spreads or becomes more painful.     Follow up as needed.  Mechele ClaudeWarren Ellouise Mcwhirter, MD

## 2017-08-09 NOTE — Patient Instructions (Signed)
Apply heat frequently to help the bruise dissipate. Notify me or the office is the area becomes more reddish than currently noted. Also if it spreads or becomes more painful.

## 2018-02-21 ENCOUNTER — Encounter: Payer: Self-pay | Admitting: Family Medicine

## 2018-02-21 ENCOUNTER — Ambulatory Visit: Payer: Self-pay | Admitting: Family Medicine

## 2018-02-21 ENCOUNTER — Ambulatory Visit (INDEPENDENT_AMBULATORY_CARE_PROVIDER_SITE_OTHER): Payer: Medicaid Other | Admitting: Family Medicine

## 2018-02-21 VITALS — BP 120/79 | HR 82 | Temp 98.4°F | Ht 62.0 in | Wt 125.4 lb

## 2018-02-21 DIAGNOSIS — R1030 Lower abdominal pain, unspecified: Secondary | ICD-10-CM | POA: Diagnosis not present

## 2018-02-21 DIAGNOSIS — R1900 Intra-abdominal and pelvic swelling, mass and lump, unspecified site: Secondary | ICD-10-CM

## 2018-02-21 LAB — URINALYSIS, COMPLETE
BILIRUBIN UA: NEGATIVE
Glucose, UA: NEGATIVE
KETONES UA: NEGATIVE
Leukocytes, UA: NEGATIVE
NITRITE UA: NEGATIVE
Protein, UA: NEGATIVE
SPEC GRAV UA: 1.02 (ref 1.005–1.030)
Urobilinogen, Ur: 1 mg/dL (ref 0.2–1.0)
pH, UA: 6 (ref 5.0–7.5)

## 2018-02-21 LAB — MICROSCOPIC EXAMINATION: Renal Epithel, UA: NONE SEEN /hpf

## 2018-02-21 NOTE — Patient Instructions (Signed)
Your urine showed some red blood cells, but this may be from your recent..  I have sent this off for urine culture and will contact you if there is bacterial growth and send you an antibiotic.  I do feel the palpable mass that you are noting on your abdomen.  This may be simply a fat deposit but it also could be a hernia.  I am going to order an abdominal ultrasound to further evaluate.  He will be contacted to schedule this.  If you develop recurrence of nausea and vomiting, inability to have a bowel movement, inability to stay hydrated or keep food down, high fevers, severe abdominal pain or any other worrisome symptoms or signs, please seek immediate medical attention in the emergency department.

## 2018-02-21 NOTE — Progress Notes (Signed)
Subjective: CC: Nausea, knot on right side. PCP: Dettinger, Elige Radon, MD NFA:[id number] Drucie Opitz is a 22 y.o. female presenting to clinic today for:  1. Right sided abdominal knot Patient notes that she felt a palpable right sided abdominal not she points to the upper aspect of the right lower quadrant as the area of concern.  She describes the pain as a constant pain but is worse with movement.  Pain tends to radiate to her back.  Denies abdominal pain with meals or high fat foods.  She notes that this has been present for about 2 weeks.  She notes that it tends to grow in size and then get smaller depending on whether or not she has been on her feet all day long.  She reports that she had nausea and vomiting about 2 days ago that have since resolved.  That was associated with a fever as well.  She is tolerating food and fluid intake without difficulty now.  Denies any diarrhea or difficulty having a bowel movement.  No dysuria.  She is unsure as to whether or not she has had blood in her urine because she just got off her period yesterday.  Denies any abnormal vaginal discharge.  No concerns for STDs or pregnancy as she has not had intercourse in greater than 12 months.  She is currently has an IUD in place.  No unplanned weight loss, night sweats, fatigue.  Past medical history significant for renal stones.   ROS: Per HPI  No Known Allergies Past Medical History:  Diagnosis Date  . History of cardiac murmur as a child   . History of kidney stones   . Migraines   . Renal calculi    BILATERAL  . Scoliosis     Current Outpatient Medications:  .  levonorgestrel (MIRENA) 20 MCG/24HR IUD, 1 each by Intrauterine route once., Disp: , Rfl:  .  loratadine (CLARITIN) 10 MG tablet, Take 10 mg by mouth daily., Disp: , Rfl:  .  ondansetron (ZOFRAN) 4 MG tablet, Take 4 mg by mouth every 8 (eight) hours as needed for nausea or vomiting., Disp: , Rfl:  .  HYDROcodone-acetaminophen (NORCO/VICODIN)  5-325 MG tablet, Take 1 tablet by mouth every 8 (eight) hours as needed for moderate pain., Disp: , Rfl:    Social History   Socioeconomic History  . Marital status: Legally Separated    Spouse name: Not on file  . Number of children: Not on file  . Years of education: Not on file  . Highest education level: Not on file  Social Needs  . Financial resource strain: Not on file  . Food insecurity - worry: Not on file  . Food insecurity - inability: Not on file  . Transportation needs - medical: Not on file  . Transportation needs - non-medical: Not on file  Occupational History  . Not on file  Tobacco Use  . Smoking status: Never Smoker  . Smokeless tobacco: Never Used  Substance and Sexual Activity  . Alcohol use: Yes    Comment: occasionally  . Drug use: No  . Sexual activity: Yes    Birth control/protection: Condom    Comment: SVD 11-28-2017/  [employer]  Other Topics Concern  . Not on file  Social History Narrative  . Not on file   Family History  Problem Relation Age of Onset  . Thyroid disease Father   . Hyperlipidemia Father     Objective: Office vital signs reviewed. BP 120/79  Pulse 82   Temp 98.4 F (36.9 C) (Oral)   Ht 5\' 2"  (1.575 m)   Wt 125 lb 6.4 oz (56.9 kg)   LMP 02/14/2018   BMI 22.94 kg/m   Physical Examination:  General: Awake, alert, well nourished, No acute distress HEENT: sclera white, MMM Cardio: regular rate and rhythm, S1S2 heard, no murmurs appreciated Pulm: clear to auscultation bilaterally, no wheezes, rhonchi or rales; normal work of breathing on room air GI: soft, non-tender, non-distended, bowel sounds present x4, no hepatomegaly, no splenomegaly, there is an egg-shaped soft tissue mass appreciated at the superior aspect of the right lower quadrant just below the level of the umbilicus.  This is best palpated with her standing.  No palpable abdominal wall defects within this region with lying down or standing.  Assessment/  Plan: 22 y.o. female   1. Lower abdominal pain Findings are somewhat suggestive of a possible abdominal hernia.  She is never had any intra-abdominal surgeries to suggest that this is a postsurgical hernia.  There was no palpable defect with her lying supine but mass was more noticeable with her standing up.  Differential diagnosis also includes lipoma.  Doubt intra-abdominal abscess but this could be a possibility as well.  Other differentials considered include ectopic pregnancy (patient has not been sexually active in almost a year and has an IUD in place). she is currently afebrile, well-appearing and demonstrating no red flag signs or symptoms.  Will obtain abdominal ultrasound to further evaluate mass.  Plan pending this evaluation.  May need to obtain CT versus MRI to further characterize but will await abdominal ultrasound read.  Her urine demonstrated 3-10 red blood cells but she did just come off her menstrual cycle.  I have sent this for urine culture and if there is bacterial growth, will send in antibiotic.  If patient demonstrates any other urinary tract symptoms, she will contact me sooner.  Red flag symptoms discussed and reason for emergent evaluation emergency department reviewed.  Patient was good understanding - Urinalysis, Complete - US MiscellaneoUS Localization; Future  2. Palpable abdominal mass - US MiscellaneoUS Localization; Future  Orders Placed This Encounter  Procedures  . Urine Culture  . US MiscellaneoUS Localization    Standing Status:   Future    Standing Expiration Date:   04/24/2019    Order Specific Question:   Reason for Exam (SYMPTOM  OR DIAGNOSIS REQUIRED)    Answer:   right sided lower abdominal mass (egg sized, best appreciated standing up). ? lipoma vs hernia    Order Specific Question:   Preferred imaging location?    Answer:   [facility]  . Urinalysis, Complete    Raliegh IpAshly M Gottschalk, DO [facility] [phone])  [phone]

## 2018-02-22 LAB — URINE CULTURE

## 2018-02-23 ENCOUNTER — Other Ambulatory Visit: Payer: Self-pay

## 2018-02-23 DIAGNOSIS — R1907 Generalized intra-abdominal and pelvic swelling, mass and lump: Secondary | ICD-10-CM

## 2018-02-23 DIAGNOSIS — R1084 Generalized abdominal pain: Secondary | ICD-10-CM

## 2018-02-28 ENCOUNTER — Ambulatory Visit (HOSPITAL_COMMUNITY)

## 2018-03-01 ENCOUNTER — Ambulatory Visit (HOSPITAL_COMMUNITY)
Admission: RE | Admit: 2018-03-01 | Discharge: 2018-03-01 | Disposition: A | Discharge status: Home / Self Care | Payer: Medicaid Other | Source: Ambulatory Visit | Attending: Family Medicine | Admitting: Family Medicine

## 2018-03-01 DIAGNOSIS — R1084 Generalized abdominal pain: Secondary | ICD-10-CM | POA: Diagnosis present

## 2018-03-01 DIAGNOSIS — R932 Abnormal findings on diagnostic imaging of liver and biliary tract: Secondary | ICD-10-CM | POA: Insufficient documentation

## 2018-03-01 DIAGNOSIS — N200 Calculus of kidney: Secondary | ICD-10-CM | POA: Diagnosis not present

## 2018-03-01 DIAGNOSIS — N281 Cyst of kidney, acquired: Secondary | ICD-10-CM | POA: Insufficient documentation

## 2018-03-02 ENCOUNTER — Telehealth: Payer: Self-pay | Admitting: Family Medicine

## 2018-03-02 NOTE — Telephone Encounter (Signed)
Refer to lab note °

## 2018-03-05 ENCOUNTER — Encounter: Payer: Self-pay | Admitting: Family Medicine

## 2018-03-05 ENCOUNTER — Ambulatory Visit (INDEPENDENT_AMBULATORY_CARE_PROVIDER_SITE_OTHER): Payer: Medicaid Other | Admitting: Family Medicine

## 2018-03-05 VITALS — BP 134/82 | HR 87 | Temp 98.6°F | Ht 62.0 in | Wt 124.0 lb

## 2018-03-05 DIAGNOSIS — R1901 Right upper quadrant abdominal swelling, mass and lump: Secondary | ICD-10-CM | POA: Diagnosis not present

## 2018-03-05 NOTE — Progress Notes (Signed)
BP 134/82   Pulse 87   Temp 98.6 F (37 C) (Oral)   Ht 5\' 2"  (1.575 m)   Wt 124 lb (56.2 kg)   LMP 02/14/2018   BMI 22.68 kg/m    Subjective:    Patient ID: Tami Santos, female    DOB: 10-Apr-1996, 22 y.o.   MRN: [id number]  HPI: [name] is a 22 y.o. female presenting on 03/05/2018 for Abdominal pain follow up   HPI Abdominal wall mass, concern for hernia Patient has a right sided upper abdominal mass that comes and goes.  She says it comes when she is up on her feet or lifting things or moving or working a lot at [employer] that she works that and then it goes away when she lays flat.  She denies any pain from it typically but just sees a bulge in and out.  She denies any constipation or blood in stool or urinary problems.  She denies any nausea or vomiting.  Patient already had an ultrasound which was negative  Relevant past medical, surgical, family and social history reviewed and updated as indicated. Interim medical history since our last visit reviewed. Allergies and medications reviewed and updated.  Review of Systems  Constitutional: Negative for chills and fever.  Eyes: Negative for visual disturbance.  Respiratory: Negative for chest tightness and shortness of breath.   Cardiovascular: Negative for chest pain and leg swelling.  Gastrointestinal: Positive for abdominal distention. Negative for abdominal pain, blood in stool, constipation, diarrhea, nausea and vomiting.  Musculoskeletal: Negative for back pain and gait problem.  Skin: Negative for rash.  Neurological: Negative for light-headedness and headaches.  Psychiatric/Behavioral: Negative for agitation and behavioral problems.  All other systems reviewed and are negative.   Per HPI unless specifically indicated above   Allergies as of 03/05/2018   No Known Allergies     Medication List        Accurate as of 03/05/18  1:23 PM. Always use your most recent med list.            HYDROcodone-acetaminophen 5-325 MG tablet Commonly known as:  NORCO/VICODIN Take 1 tablet by mouth every 8 (eight) hours as needed for moderate pain.   levonorgestrel 20 MCG/24HR IUD Commonly known as:  MIRENA 1 each by Intrauterine route once.   loratadine 10 MG tablet Commonly known as:  CLARITIN Take 10 mg by mouth daily.   ondansetron 4 MG tablet Commonly known as:  ZOFRAN Take 4 mg by mouth every 8 (eight) hours as needed for nausea or vomiting.          Objective:    BP 134/82   Pulse 87   Temp 98.6 F (37 C) (Oral)   Ht 5\' 2"  (1.575 m)   Wt 124 lb (56.2 kg)   LMP 02/14/2018   BMI 22.68 kg/m   Wt Readings from Last 3 Encounters:  03/05/18 124 lb (56.2 kg)  02/21/18 125 lb 6.4 oz (56.9 kg)  08/09/17 134 lb (60.8 kg)    Physical Exam  Constitutional: She is oriented to person, place, and time. She appears well-developed and well-nourished. No distress.  Eyes: Conjunctivae are normal.  Abdominal: Soft. Bowel sounds are normal. She exhibits no distension. There is no tenderness. There is no rigidity, no rebound, no guarding and no CVA tenderness. A hernia is present. Hernia confirmed positive in the ventral area.    Neurological: She is alert and oriented to person, place, and time. Coordination  normal.  Skin: Skin is warm and dry. No rash noted. She is not diaphoretic.  Psychiatric: She has a normal mood and affect. Her behavior is normal.  Nursing note and vitals reviewed.       Assessment & Plan:   Problem List Items Addressed This Visit    None    Visit Diagnoses    Right upper quadrant abdominal mass    -  Primary   Relevant Orders   CT Abdomen Pelvis W Contrast       Follow up plan: Return if symptoms worsen or fail to improve.  Counseling provided for all of the vaccine components Orders Placed This Encounter  Procedures  . CT Abdomen Pelvis W Contrast    Arville CareJoshua Breindel Collier, MD [facility] 03/05/2018, 1:23  PM

## 2018-03-30 ENCOUNTER — Ambulatory Visit (HOSPITAL_COMMUNITY): Payer: Medicaid Other

## 2018-04-12 ENCOUNTER — Ambulatory Visit (HOSPITAL_COMMUNITY): Payer: Medicaid Other

## 2018-04-30 ENCOUNTER — Ambulatory Visit (HOSPITAL_COMMUNITY)
Admission: RE | Admit: 2018-04-30 | Discharge: 2018-04-30 | Disposition: A | Discharge status: Home / Self Care | Payer: Medicaid Other | Source: Ambulatory Visit | Attending: Family Medicine | Admitting: Family Medicine

## 2018-04-30 DIAGNOSIS — Z975 Presence of (intrauterine) contraceptive device: Secondary | ICD-10-CM | POA: Insufficient documentation

## 2018-04-30 DIAGNOSIS — N200 Calculus of kidney: Secondary | ICD-10-CM | POA: Insufficient documentation

## 2018-04-30 DIAGNOSIS — R1901 Right upper quadrant abdominal swelling, mass and lump: Secondary | ICD-10-CM

## 2018-04-30 MED ORDER — IOPAMIDOL (ISOVUE-300) INJECTION 61%
100.0000 mL | Freq: Once | INTRAVENOUS | Status: AC | PRN
  Administered 2018-04-30: 100 mL via INTRAVENOUS

## 2018-05-01 ENCOUNTER — Ambulatory Visit (HOSPITAL_COMMUNITY): Payer: Medicaid Other

## 2018-05-02 ENCOUNTER — Encounter: Payer: Self-pay | Admitting: Physician Assistant

## 2018-05-02 ENCOUNTER — Ambulatory Visit (INDEPENDENT_AMBULATORY_CARE_PROVIDER_SITE_OTHER): Payer: Medicaid Other | Admitting: Physician Assistant

## 2018-05-02 VITALS — BP 119/80 | HR 96 | Temp 98.6°F | Ht 62.0 in | Wt 122.2 lb

## 2018-05-02 DIAGNOSIS — J029 Acute pharyngitis, unspecified: Secondary | ICD-10-CM

## 2018-05-02 LAB — RAPID STREP SCREEN (MED CTR MEBANE ONLY): Strep Gp A Ag, IA W/Reflex: NEGATIVE

## 2018-05-02 LAB — CULTURE, GROUP A STREP

## 2018-05-02 NOTE — Progress Notes (Signed)
  Subjective:     Patient ID: Tami Santos, female   DOB: 11/13/1996, 22 y.o.   MRN: [id number]  HPI Pt with headache, ST,and bilat ear for 2-3 days No meds for sx  Review of Systems  Constitutional: Positive for fatigue. Negative for activity change, appetite change, chills and fever.  HENT: Positive for congestion, ear pain, postnasal drip, sinus pressure and sore throat. Negative for ear discharge, mouth sores, nosebleeds, rhinorrhea, sinus pain and voice change.   Eyes: Negative.   Respiratory: Positive for cough. Negative for chest tightness, shortness of breath and wheezing.   Cardiovascular: Positive for chest pain.       Objective:   Physical Exam  Constitutional: She appears well-developed and well-nourished.  Non-toxic appearance. She does not appear ill. No distress.  HENT:  Right Ear: Tympanic membrane and ear canal normal.  Left Ear: Tympanic membrane and ear canal normal.  Mouth/Throat: Uvula is midline and mucous membranes are normal. No oral lesions. No uvula swelling. Posterior oropharyngeal erythema present. No oropharyngeal exudate, posterior oropharyngeal edema or tonsillar abscesses. Tonsils are 0 on the right. Tonsils are 0 on the left. No tonsillar exudate.  Neck: Neck supple.  Cardiovascular: Normal rate, regular rhythm and normal heart sounds.  Lymphadenopathy:    She has no cervical adenopathy.  Nursing note and vitals reviewed.  RST neg    Assessment:     1. Pharyngitis, unspecified etiology   2. Sore throat        Plan:     Fluids Rest OTC meds for sx Warm salt water gargles F/U prn

## 2018-05-02 NOTE — Patient Instructions (Signed)

## 2018-05-10 ENCOUNTER — Telehealth: Payer: Self-pay | Admitting: Family Medicine

## 2018-05-10 NOTE — Telephone Encounter (Signed)
Pt aware no acute CT finding for left sided abdominal pain per radiologist report, pt voiced understanding.

## 2018-09-07 ENCOUNTER — Other Ambulatory Visit: Payer: Self-pay | Admitting: Urology

## 2018-09-12 ENCOUNTER — Encounter: Payer: Self-pay | Admitting: Endocrinology

## 2018-09-12 ENCOUNTER — Encounter (HOSPITAL_COMMUNITY): Payer: Self-pay | Admitting: General Practice

## 2018-09-12 ENCOUNTER — Ambulatory Visit: Payer: Medicaid Other | Admitting: Endocrinology

## 2018-09-12 VITALS — BP 116/78 | HR 86 | Ht 62.0 in | Wt 125.0 lb

## 2018-09-12 DIAGNOSIS — R002 Palpitations: Secondary | ICD-10-CM | POA: Diagnosis not present

## 2018-09-12 DIAGNOSIS — R7989 Other specified abnormal findings of blood chemistry: Secondary | ICD-10-CM | POA: Diagnosis not present

## 2018-09-12 LAB — T4, FREE: FREE T4: 0.91 ng/dL (ref 0.60–1.60)

## 2018-09-12 LAB — T3, FREE: T3 FREE: 4.1 pg/mL (ref 2.3–4.2)

## 2018-09-12 LAB — TSH: TSH: 1.3 u[IU]/mL (ref 0.35–4.50)

## 2018-09-12 NOTE — Progress Notes (Signed)
Please call to let patient know that the lab results are normal and no further action needed, to follow-up with her PCP for cardiology consultation

## 2018-09-12 NOTE — Progress Notes (Addendum)
Patient ID: Tami Santos, female   DOB: 04/20/1996, 22 y.o.   MRN: [id number]                                                                                                               Reason for Appointment:  Hyperthyroidism, new consultation  Referring healthcare provider: Lubertha South, NP   Chief complaint: Palpitations   History of Present Illness:   For the last 4 months patient has had symptoms of palpitations on and off These will frequently wake her up at night with her heart racing and will last about an hour Occasionally may have this during the day also  She does not complain of any shakiness, feeling excessively warm and sweaty or weight loss  She may be having a little more nervousness, and fatigue since May also. Initially she may have had some decreased appetite and slight decrease in weight but this is now stable May also be losing a little more hair  She was evaluated on 05/11/2018 with thyroid levels and again 2 weeks later but she has not been seen in consultation until now When she was seen in May she was also having a sore throat and she thinks she may have been having pain in her neck area in the front also  Wt Readings from Last 3 Encounters:  09/12/18 125 lb (56.7 kg)  05/02/18 122 lb 4 oz (55.5 kg)  03/05/18 124 lb (56.2 kg)      Thyroid function tests as follows: TSH on 05/11/2018 is 0.27 Repeat on 05/24/2018 was 0.126 Free T4 is doing 1.45, normal   No results found for: FREET4, T3FREE, TSH  No results found for: THYROTRECAB   Allergies as of 09/12/2018   No Known Allergies     Medication List        Accurate as of 09/12/18  2:18 PM. Always use your most recent med list.          levonorgestrel 20 MCG/24HR IUD Commonly known as:  MIRENA 1 each by Intrauterine route once.   loratadine 10 MG tablet Commonly known as:  CLARITIN Take 10 mg by mouth daily.   ondansetron 4 MG tablet Commonly known as:  ZOFRAN Take 4 mg by mouth  every 8 (eight) hours as needed for nausea or vomiting.           Past Medical History:  Diagnosis Date  . History of cardiac murmur as a child   . History of kidney stones   . Migraines   . Renal calculi    BILATERAL  . Scoliosis     Past Surgical History:  Procedure Laterality Date  . CYST REMOVAL NECK  child   neck/chest area  . CYSTOSCOPY WITH RETROGRADE PYELOGRAM, URETEROSCOPY AND STENT PLACEMENT Bilateral 02/27/2017   Procedure: CYSTOSCOPY WITH RETROGRADE PYELOGRAM, URETEROSCOPY, STONE BASKETRY AND STENT PLACEMENT;  Surgeon: Malen Gauze, MD;  Location[facility];  Service: Urology;  Laterality: Bilateral;    Family History  Problem Relation Age of  Onset  . Thyroid disease Father   . Hyperlipidemia Father     Social History:  reports that she has never smoked. She has never used smokeless tobacco. She reports that she drinks alcohol. She reports that she does not use drugs.  Allergies: No Known Allergies   Review of Systems  Constitutional: Negative for reduced appetite.  HENT: Negative for trouble swallowing.   Eyes: Negative for visual disturbance.  Respiratory: Negative for daytime sleepiness.   Cardiovascular: Positive for palpitations.  Gastrointestinal: Negative for abdominal pain.  Endocrine:       Menstrual cycles not regular because of being on Mirena  Musculoskeletal: Negative for joint pain.  Neurological: Negative for weakness.  Psychiatric/Behavioral: Negative for insomnia.       Examination:   BP 116/78   Pulse 86   Ht 5\' 2"  (1.575 m)   Wt 125 lb (56.7 kg)   SpO2 98%   BMI 22.86 kg/m    General Appearance:  well-built and nourished, pleasant, mildly anxious but not hyperkinetic.         Eyes: No abnormal prominence, lid lag or stare present.  No swelling of the eyelids   Neck: The thyroid is not enlarged although right lobe is just palpable and soft Left lobe not palpable No nodules felt There is no  lymphadenopathy in the neck .           Heart: normal S1 and S2, no murmurs .          Lungs: breath sounds are normal bilaterally without added sounds  Abdomen: no hepatosplenomegaly or other palpable abnormality  Extremities: hands are not unusually warm. No ankle edema.  Neurological:  No fine tremors are present. Deep tendon reflexes at biceps are brisk.  Skin: No rash, abnormal thickening of the skin on the lower legs seen     Assessment/Plan:   Hyperthyroidism?,  Transient  Her history is not clearly suggestive of hypothyroidism with main symptom of intermittent palpitations only Although her TSH was low in June she did not have a high free T4 level and T3 not tested at that time Not clear if she had thyroid tenderness at that time indicating subacute thyroiditis  Patient understands the above discussion and treatment options. All questions were answered satisfactorily  Consult note sent to referring physician  Reather Littler 09/12/2018, 2:18 PM    Note: This office note was prepared with Dragon voice recognition system technology. Any transcriptional errors that result from this process are unintentional.  ADDENDUM: TSH is normal, does not need any further evaluation, likely may have had silent thyroiditis To follow-up with PCP  Reather Littler

## 2018-09-14 NOTE — H&P (Signed)
Urology Preoperative H&P   Chief Complaint: Right renal stone  History of Present Illness: Tami Santos is a 22 y.o. female with a history of kidney stones.  She had a KUB on 09/06/18 that showed a 6 mm right renal stone.  No hydronephrosis noted on RUS from the same day.  She denies flank pain, dysuria, hematuria or recent stone passaged.  She is here today for ESWL of her right renal stone.  Past Medical History:  Diagnosis Date  . History of cardiac murmur as a child   . History of kidney stones   . Migraines   . Renal calculi    BILATERAL  . Scoliosis     Past Surgical History:  Procedure Laterality Date  . CYST REMOVAL NECK  child   neck/chest area  . CYSTOSCOPY WITH RETROGRADE PYELOGRAM, URETEROSCOPY AND STENT PLACEMENT Bilateral 02/27/2017   Procedure: CYSTOSCOPY WITH RETROGRADE PYELOGRAM, URETEROSCOPY, STONE BASKETRY AND STENT PLACEMENT;  Surgeon: Malen Gauze, MD;  Location: [facility];  Service: Urology;  Laterality: Bilateral;    Allergies: No Known Allergies  Family History  Problem Relation Age of Onset  . Thyroid disease Father   . Hyperlipidemia Father   . Hypertension Paternal Grandfather     Social History:  reports that she has never smoked. She has never used smokeless tobacco. She reports that she drinks alcohol. She reports that she does not use drugs.  ROS: A complete review of systems was performed.  All systems are negative except for pertinent findings as noted.  Physical Exam:  Vital signs in last 24 hours:   Constitutional:  Alert and oriented, No acute distress Cardiovascular: Regular rate and rhythm, No JVD Respiratory: Normal respiratory effort, Lungs clear bilaterally GI: Abdomen is soft, nontender, nondistended, no abdominal masses GU: No CVA tenderness Lymphatic: No lymphadenopathy Neurologic: Grossly intact, no focal deficits Psychiatric: Normal mood and affect  Laboratory Data:  No results for input(s): WBC,  HGB, HCT, PLT in the last 72 hours.  No results for input(s): NA, K, CL, GLUCOSE, BUN, CALCIUM, CREATININE in the last 72 hours.  Invalid input(s): CO3   No results found for this or any previous visit (from the past 24 hour(s)). No results found for this or any previous visit (from the past 240 hour(s)).  Renal Function: No results for input(s): CREATININE in the last 168 hours. CrCl cannot be calculated (No successful lab value found.).  Radiologic Imaging: No results found.  I independently reviewed the above imaging studies.  Assessment and Plan Tami Santos is a 22 y.o. female with a 6 mm right renal stone   The risks, benefits and alternatives of RIGHT ESWL was discussed with the patient. I described the risks which include arrhythmia, kidney contusion, kidney hemorrhage, need for transfusion, back discomfort, flank ecchymosis, flank abrasion, inability to fracture the stone, inability to pass stone fragments, Steinstrasse, infection associated with obstructing stones, need for an alternative surgical procedure and possible need for repeat shockwave lithotripsy.  The patient voices understanding and wishes to proceed.    Rhoderick Moody, MD 09/14/2018, 1:37 PM  [facility] Pager: [phone]

## 2018-09-17 ENCOUNTER — Ambulatory Visit (HOSPITAL_COMMUNITY)
Admission: RE | Admit: 2018-09-17 | Discharge: 2018-09-17 | Disposition: A | Discharge status: Home / Self Care | Payer: Medicaid Other | Source: Ambulatory Visit | Attending: Urology | Admitting: Urology

## 2018-09-17 ENCOUNTER — Encounter (HOSPITAL_COMMUNITY)
Admission: RE | Disposition: A | Discharge status: Home / Self Care | Payer: Self-pay | Source: Ambulatory Visit | Attending: Urology

## 2018-09-17 ENCOUNTER — Encounter (HOSPITAL_COMMUNITY): Payer: Self-pay | Admitting: *Deleted

## 2018-09-17 ENCOUNTER — Ambulatory Visit (HOSPITAL_COMMUNITY): Payer: Medicaid Other

## 2018-09-17 ENCOUNTER — Other Ambulatory Visit: Payer: Self-pay

## 2018-09-17 DIAGNOSIS — Z8349 Family history of other endocrine, nutritional and metabolic diseases: Secondary | ICD-10-CM | POA: Diagnosis not present

## 2018-09-17 DIAGNOSIS — N200 Calculus of kidney: Secondary | ICD-10-CM

## 2018-09-17 DIAGNOSIS — Z87442 Personal history of urinary calculi: Secondary | ICD-10-CM | POA: Insufficient documentation

## 2018-09-17 DIAGNOSIS — Z8249 Family history of ischemic heart disease and other diseases of the circulatory system: Secondary | ICD-10-CM | POA: Diagnosis not present

## 2018-09-17 DIAGNOSIS — Z833 Family history of diabetes mellitus: Secondary | ICD-10-CM | POA: Insufficient documentation

## 2018-09-17 DIAGNOSIS — M419 Scoliosis, unspecified: Secondary | ICD-10-CM | POA: Insufficient documentation

## 2018-09-17 DIAGNOSIS — Z79899 Other long term (current) drug therapy: Secondary | ICD-10-CM | POA: Insufficient documentation

## 2018-09-17 DIAGNOSIS — Z825 Family history of asthma and other chronic lower respiratory diseases: Secondary | ICD-10-CM | POA: Diagnosis not present

## 2018-09-17 HISTORY — PX: EXTRACORPOREAL SHOCK WAVE LITHOTRIPSY: SHX1557

## 2018-09-17 LAB — PREGNANCY, URINE: PREG TEST UR: NEGATIVE

## 2018-09-17 SURGERY — LITHOTRIPSY, ESWL
Anesthesia: LOCAL | Laterality: Right

## 2018-09-17 MED ORDER — CIPROFLOXACIN HCL 500 MG PO TABS
500.0000 mg | ORAL_TABLET | ORAL | Status: AC
  Administered 2018-09-17: 500 mg via ORAL
  Filled 2018-09-17: qty 1

## 2018-09-17 MED ORDER — SODIUM CHLORIDE 0.9 % IV SOLN
INTRAVENOUS | Status: DC
  Administered 2018-09-17: 10:00:00 via INTRAVENOUS

## 2018-09-17 MED ORDER — DIPHENHYDRAMINE HCL 25 MG PO CAPS
25.0000 mg | ORAL_CAPSULE | ORAL | Status: AC
  Administered 2018-09-17: 25 mg via ORAL
  Filled 2018-09-17: qty 1

## 2018-09-17 MED ORDER — DIAZEPAM 5 MG PO TABS
10.0000 mg | ORAL_TABLET | ORAL | Status: AC
  Administered 2018-09-17: 10 mg via ORAL
  Filled 2018-09-17: qty 2

## 2018-09-17 NOTE — Op Note (Signed)
[facility] Operative Note  Treating Physician: Rhoderick Moody, MD  Pre-op diagnosis: Two, 4 mm right renal stones  Post-op diagnosis: Same   Procedure: RIGHT ESWL  See Rojelio Brenner OP note scanned into chart. Also because of the size, density, location and other factors that cannot be anticipated I feel this will likely be a staged procedure. This fact supersedes any indication in the scanned [place] stone operative note to the contrary

## 2018-09-18 ENCOUNTER — Encounter (HOSPITAL_COMMUNITY): Payer: Self-pay | Admitting: Urology

## 2018-09-22 IMAGING — CT CT ABD-PELV W/ CM
2 of 4 series · 16 of 46 positions shown, 18 images · IV contrast (Isovue)
Comparison: CT 11/04/2017

CLINICAL DATA: 21-year-old female with a history of left-sided
abdominal pain with diarrhea and nausea

EXAM:
CT ABDOMEN AND PELVIS WITH CONTRAST
TECHNIQUE: Multidetector CT imaging of the abdomen and pelvis was performed
using the standard protocol following bolus administration of
intravenous contrast.
CONTRAST:  100mL KC82LC-KII IOPAMIDOL (KC82LC-KII) INJECTION 61%

[Series 2: axial st · axial · 0.64mm/px · z∈[-425,-85]mm · 13 of 76 slices shown, 15 images]
[im 4/76  soft-tissue]
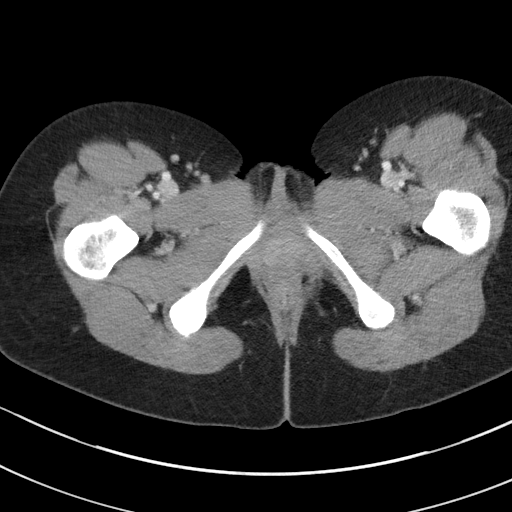
[im 4/76  bone]
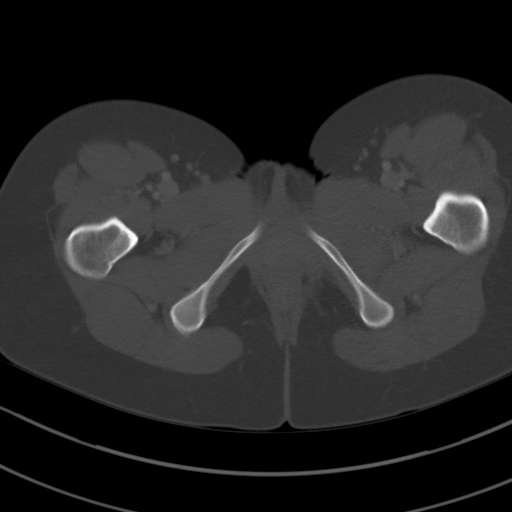
[im 10/76  soft-tissue]
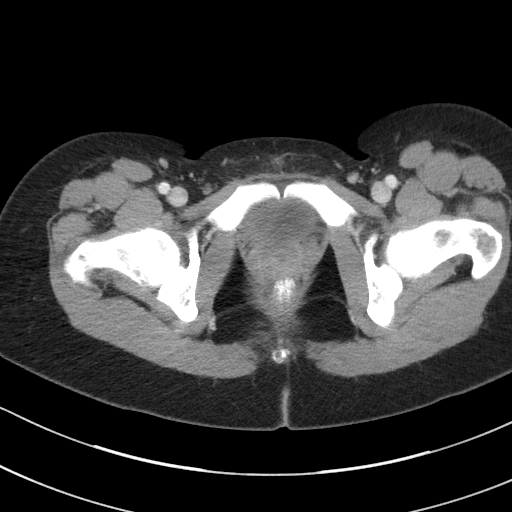
[im 17/76  soft-tissue]
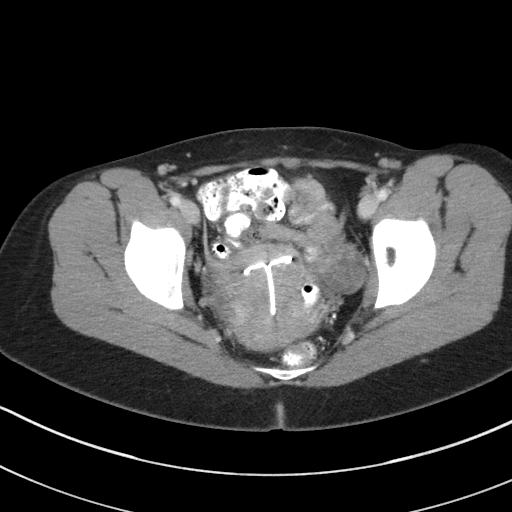
[im 20/76  soft-tissue]
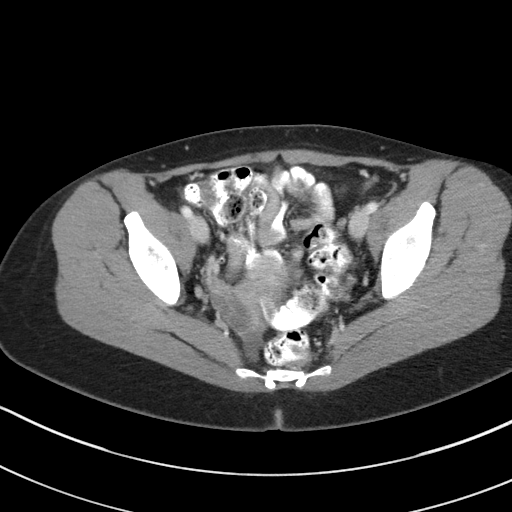
[im 27/76  soft-tissue]
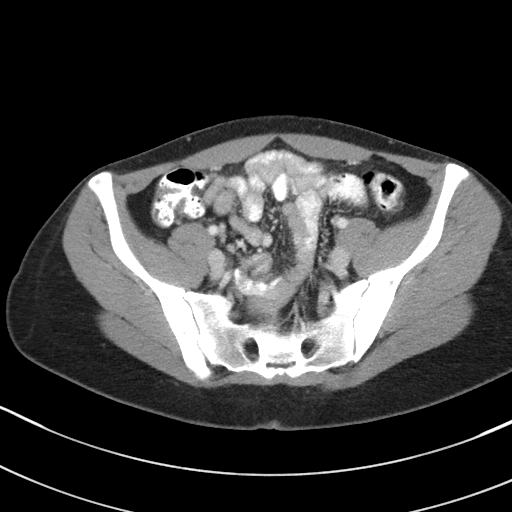
[im 33/76  soft-tissue]
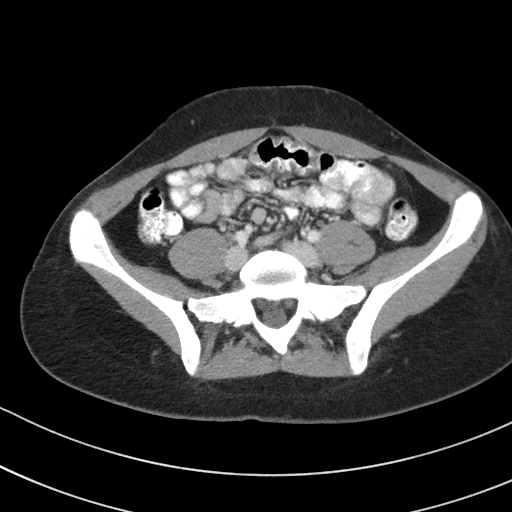
[im 40/76  soft-tissue]
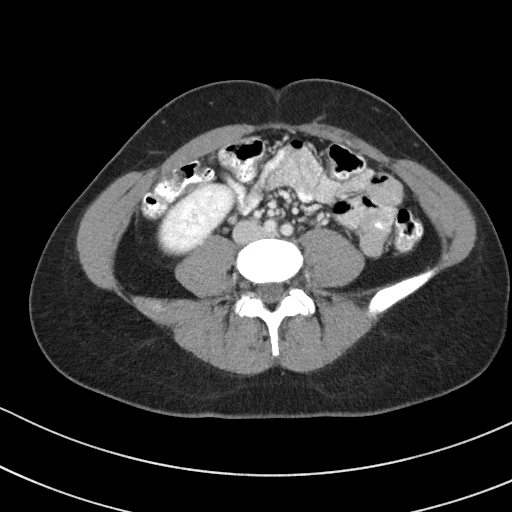
[im 43/76  soft-tissue]
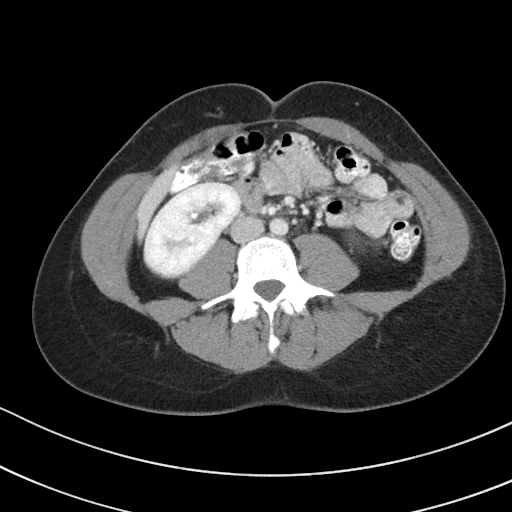
[im 49/76  soft-tissue]
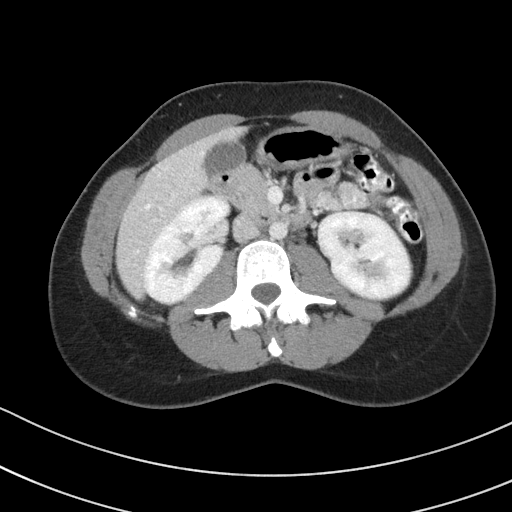
[im 49/76  bone]
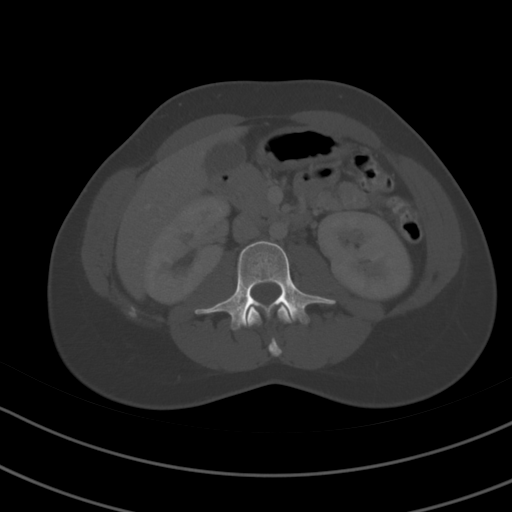
[im 56/76  soft-tissue]
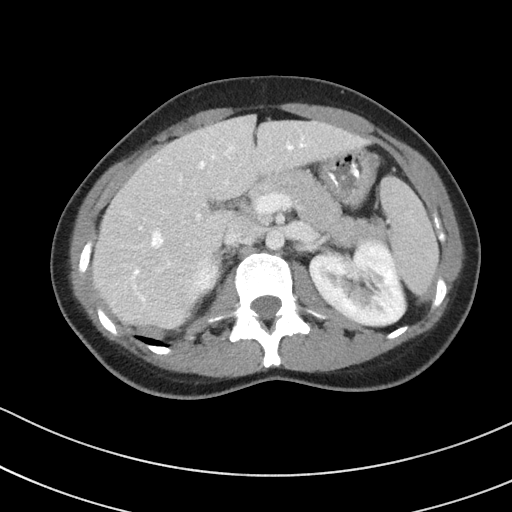
[im 59/76  soft-tissue]
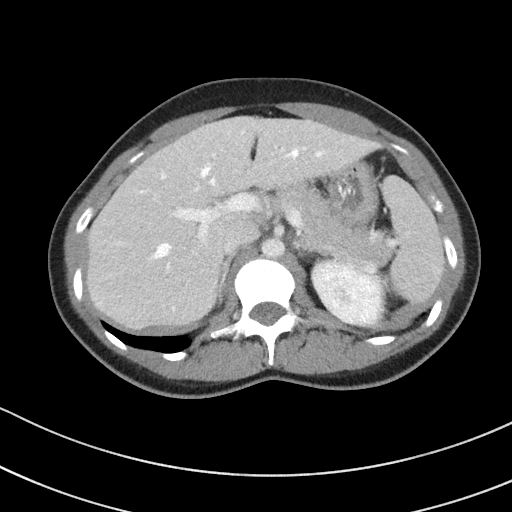
[im 66/76  soft-tissue]
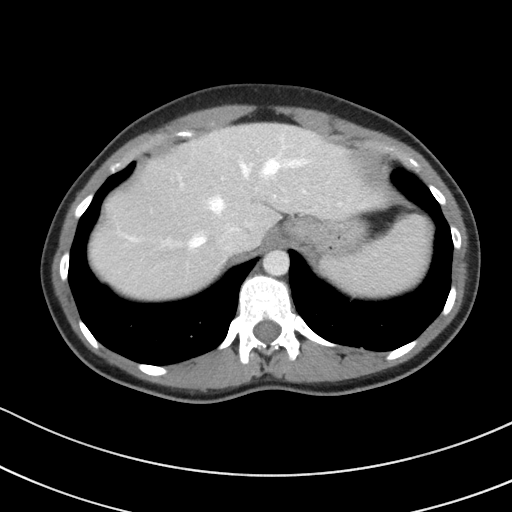
[im 72/76  soft-tissue]
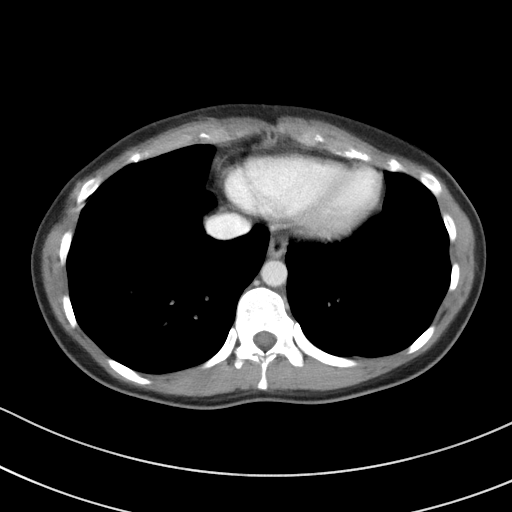

[Series 5: coronal st · coronal · 0.68mm/px · 3 of 75 slices shown]
[im 25/75  soft-tissue]
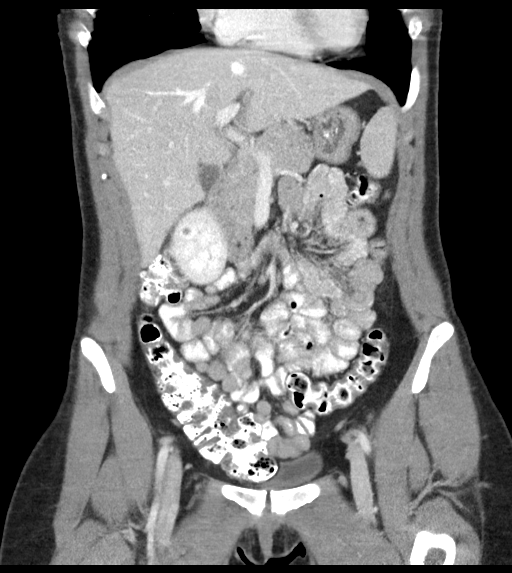
[im 33/75  soft-tissue]
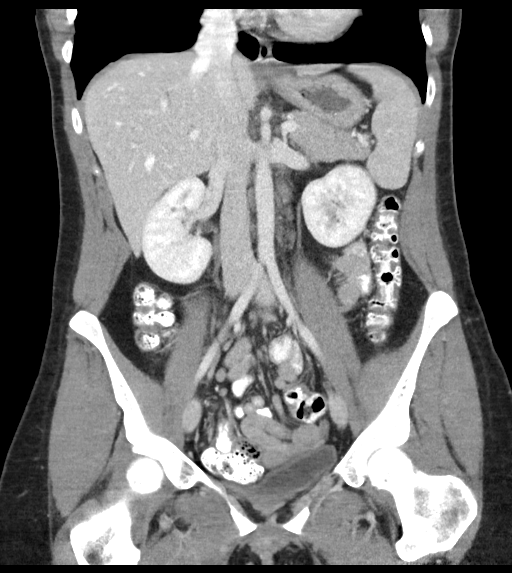
[im 42/75  soft-tissue]
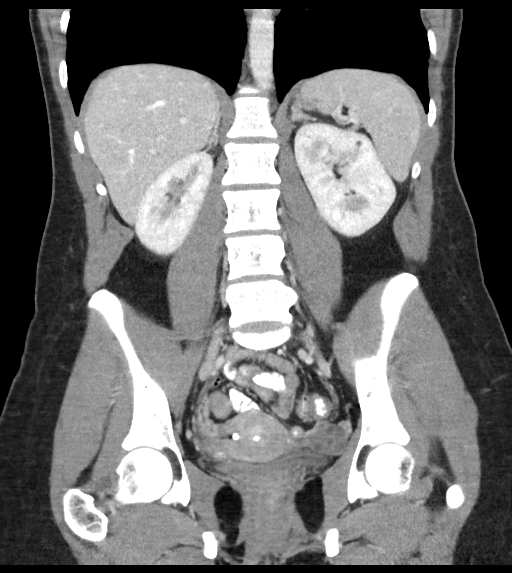

[16 of 46 positions shown; findings below may reference images not displayed]

FINDINGS: Lower chest: No acute abnormality.

Hepatobiliary: Unremarkable liver parenchyma. Unremarkable
gallbladder

Pancreas: Unremarkable pancreas

Spleen: Unremarkable spleen

Adrenals/Urinary Tract: Unremarkable adrenal glands.

Nonobstructive nephrolithiasis of the inferior collecting system of
the right kidney measuring 4 mm-5 mm. There are small subcentimeter
cystic lesions of the right kidney, too small to completely
characterize. No hydronephrosis.

No evidence of left-sided hydronephrosis or nephrolithiasis. No
perinephric stranding.

Unremarkable course the bilateral ureters.

Unremarkable appearance of the urinary bladder.

Stomach/Bowel: Unremarkable appearance of stomach. Left abdomen
demonstrates a short segment intussusception (image 39 of series 2),
with no evidence distended small bowel to indicate obstruction.
Enteric contrast reaches the distal colon. Normal appendix.

No inflammatory changes adjacent to small bowel or colon.

Vascular/Lymphatic: Unremarkable arterial system.  No adenopathy.

Reproductive: Physiologic changes of the adnexa. Intrauterine device
in place

Other: No abdominal wall hernia or abnormality. No abdominopelvic
ascites.

Musculoskeletal: No acute or significant osseous findings.
IMPRESSION: No acute CT finding to account for the patient's left-sided
abdominal pain.

Nonobstructive right-sided nephrolithiasis.

Intrauterine device.

## 2021-10-15 ENCOUNTER — Encounter: Payer: Self-pay | Admitting: *Deleted

## 2021-10-18 ENCOUNTER — Ambulatory Visit (INDEPENDENT_AMBULATORY_CARE_PROVIDER_SITE_OTHER): Payer: Medicaid Other | Admitting: Cardiology

## 2021-10-18 ENCOUNTER — Encounter: Payer: Self-pay | Admitting: Cardiology

## 2021-10-18 VITALS — BP 123/75 | HR 74 | Ht 62.0 in | Wt 116.4 lb

## 2021-10-18 DIAGNOSIS — Z8249 Family history of ischemic heart disease and other diseases of the circulatory system: Secondary | ICD-10-CM | POA: Diagnosis not present

## 2021-10-18 DIAGNOSIS — R079 Chest pain, unspecified: Secondary | ICD-10-CM | POA: Diagnosis not present

## 2021-10-18 NOTE — Patient Instructions (Signed)
Medication Instructions:  Continue all current medications.  Labwork: Genetic counseling   Testing/Procedures: Your physician has requested that you have an echocardiogram. Echocardiography is a painless test that uses sound waves to create images of your heart. It provides your doctor with information about the size and shape of your heart and how well your heart's chambers and valves are working. This procedure takes approximately one hour. There are no restrictions for this procedure. Office will contact with results via phone or letter.     Follow-Up: Pending   Any Other Special Instructions Will Be Listed Below (If Applicable).   If you need a refill on your cardiac medications before your next appointment, please call your pharmacy.

## 2021-10-18 NOTE — Progress Notes (Signed)
Clinical Summary Ms. Jezewski is a 25 y.o.female  1. Family history of hypertrophic cardiomyopathy.  -mother with history of hypertrophic cardiomyopathy complicated by VT with AICD - mother had genetic testing,she was MYH7 [id number] positive, inheritance if autosomal dominant.      - patient reports chest pain tightness or burning feeling thoart down into chest. Can occur at rest or with exertion. Lasts a few minutes, burning can last longer. Can have some associated fatigue - no history of syncope - no exertional dyspnea. - maternal aunt died age 12. Her son passed age 107. Maternal grandmother died 9.      Past Medical History:  Diagnosis Date   Anxiety about health    Chest discomfort    History of cardiac murmur as a child    History of kidney stones    Migraines    Osteoarthritis, unspecified osteoarthritis type, unspecified site    Renal calculi    BILATERAL   Scoliosis    TSH (thyroid-stimulating hormone deficiency)      No Known Allergies   Current Outpatient Medications  Medication Sig Dispense Refill   acetaminophen (TYLENOL) 325 MG tablet Take 650 mg by mouth every 6 (six) hours as needed.     cetirizine (ZYRTEC) 10 MG tablet Take 10 mg by mouth daily.     cholecalciferol (VITAMIN D) 1000 units tablet Take 1,000 Units by mouth daily.     Cranberry 405 MG CAPS Take by mouth as directed.     escitalopram (LEXAPRO) 10 MG tablet Take 1 tablet by mouth daily.     levonorgestrel (MIRENA) 20 MCG/24HR IUD 1 each by Intrauterine route once.     loratadine (CLARITIN) 10 MG tablet Take 10 mg by mouth daily.     meloxicam (MOBIC) 15 MG tablet Take 15 mg by mouth daily.     ondansetron (ZOFRAN) 4 MG tablet Take 4 mg by mouth every 8 (eight) hours as needed for nausea or vomiting.     No current facility-administered medications for this visit.     Past Surgical History:  Procedure Laterality Date   CYST REMOVAL NECK  child   neck/chest area   CYSTOSCOPY  WITH RETROGRADE PYELOGRAM, URETEROSCOPY AND STENT PLACEMENT Bilateral 02/27/2017   Procedure: CYSTOSCOPY WITH RETROGRADE PYELOGRAM, URETEROSCOPY, STONE BASKETRY AND STENT PLACEMENT;  Surgeon: Malen Gauze, MD;  Location: [facility];  Service: Urology;  Laterality: Bilateral;   EXTRACORPOREAL SHOCK WAVE LITHOTRIPSY Right 09/17/2018   Procedure: RIGHT EXTRACORPOREAL SHOCK WAVE LITHOTRIPSY ([facility]);  Surgeon: Rene Paci, MD;  Location: [facility];  Service: Urology;  Laterality: Right;   MOUTH SURGERY     2018     No Known Allergies    Family History  Problem Relation Age of Onset   Thyroid disease Father    Hyperlipidemia Father    Hypertension Paternal Grandfather      Social History Ms. Tapper reports that she has never smoked. She has never used smokeless tobacco. Ms. Rison reports current alcohol use.   Review of Systems CONSTITUTIONAL: No weight loss, fever, chills, weakness or fatigue.  HEENT: Eyes: No visual loss, blurred vision, double vision or yellow sclerae.No hearing loss, sneezing, congestion, runny nose or sore throat.  SKIN: No rash or itching.  CARDIOVASCULAR: per hpi RESPIRATORY: No shortness of breath, cough or sputum.  GASTROINTESTINAL: No anorexia, nausea, vomiting or diarrhea. No abdominal pain or blood.  GENITOURINARY: No burning on urination, no polyuria NEUROLOGICAL: No headache, dizziness, syncope, paralysis,  ataxia, numbness or tingling in the extremities. No change in bowel or bladder control.  MUSCULOSKELETAL: No muscle, back pain, joint pain or stiffness.  LYMPHATICS: No enlarged nodes. No history of splenectomy.  PSYCHIATRIC: No history of depression or anxiety.  ENDOCRINOLOGIC: No reports of sweating, cold or heat intolerance. No polyuria or polydipsia.  Marland Kitchen   Physical Examination Today's Vitals   10/18/21 1500  BP: 123/75  Pulse: 74  SpO2: 98%  Weight: 116 lb 6.4 oz (52.8 kg)  Height: 5\' 2"  (1.575 m)   Body  mass index is 21.29 kg/m.  Gen: resting comfortably, no acute distress HEENT: no scleral icterus, pupils equal round and reactive, no palptable cervical adenopathy,  CV:RRR, no m/rg, no jvd Resp: Clear to auscultation bilaterally GI: abdomen is soft, non-tender, non-distended, normal bowel sounds, no hepatosplenomegaly MSK: extremities are warm, no edema.  Skin: warm, no rash Neuro:  no focal deficits Psych: appropriate affect      Assessment and Plan  1.Family history of hypertrophic CM - mother with history of HOCM complicated by VT, she has AICD - mother was + for  MYH7 gene. Patient is asking for both structural assessment with echo as well as genetic testing.  - patient with nonspecific symptoms of chest tightness, pressure.  - will obtain echo, will refer to genetic counseling.  - EKG shows NSR     [id number], M.D.

## 2021-10-22 ENCOUNTER — Ambulatory Visit (INDEPENDENT_AMBULATORY_CARE_PROVIDER_SITE_OTHER): Payer: Medicaid Other

## 2021-10-22 ENCOUNTER — Other Ambulatory Visit: Payer: Self-pay

## 2021-10-22 DIAGNOSIS — R079 Chest pain, unspecified: Secondary | ICD-10-CM

## 2021-10-22 LAB — ECHOCARDIOGRAM COMPLETE
Area-P 1/2: 4.17 cm2
Calc EF: 78 %
S' Lateral: 2.65 cm
Single Plane A2C EF: 78.3 %
Single Plane A4C EF: 77.6 %

## 2021-11-01 ENCOUNTER — Telehealth: Payer: Self-pay | Admitting: *Deleted

## 2021-11-01 ENCOUNTER — Encounter: Payer: Self-pay | Admitting: *Deleted

## 2021-11-01 ENCOUNTER — Telehealth: Payer: Self-pay | Admitting: Cardiology

## 2021-11-01 NOTE — Telephone Encounter (Signed)
-----   Message from Antoine Poche, MD sent at 11/01/2021  1:17 PM EST ----- Completely normal echocardiogram. No evidence of any abnormal heart conditions by echo, we will see how her genetics appointment goes  Dominga Ferry MD

## 2021-11-01 NOTE — Telephone Encounter (Signed)
Lesle Chris, LPN  [id number]/[id number]  3:34 PM EST Back to Top    Voice mail nor my chart set up.  Patient notified of results by letter.  Coy to pcp.

## 2021-11-01 NOTE — Telephone Encounter (Signed)
PT IS RETURNING CALL FROM A WEEK AGO FOR ECHO RESULTS

## 2021-11-03 NOTE — Telephone Encounter (Signed)
Patient notified and verbalized understanding. 

## 2022-01-25 ENCOUNTER — Ambulatory Visit: Payer: Medicaid Other | Admitting: Genetic Counselor

## 2022-01-25 ENCOUNTER — Other Ambulatory Visit: Payer: Self-pay

## 2022-01-28 NOTE — Progress Notes (Signed)
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  Tami Santos (IV.28) is a 26 year-old Caucasian lady who is here today with her mother, Tami Santos. She reports having chest pains for several years and a rapid heart rate. Her mother tells me that she has anxiety that may be making her symptomatic. She underwent cardiac screening for HCM and had a normal echocardiogram. She is very concerned about her risk of having HCM and is keen on pursuing genetic testing for the familial variant.  Family history Tami Santos (III.1) is the proband of the family. She was found to have HCM at age 45 and now has an ICD. Tami Santos's brother underwent genetic testing along with his 59 month-old son at the behest of his pediatrician. Both tested positive for the familial pathogenic MYH7 variant. anxiety.   Tami Santos has a younger sister (III.2) age 59 and two younger brothers (III.3, III.4) ages 67 and 35. There is no history of heart disease amongst her siblings (III.2-III.4) or their children (V.3-V.13) as well as in her paternal relatives. Her father (II.7) is now 43 and is in good health. She is not close to her paternal relatives and is not familiar with their medical history.  Tami Santos's mother (II.6) is now 51. She mentions that her mother had a bout of dizziness at age 3 that was thought to be vertigo. Her mother has not been evaluated for HCM by a cardiologist due to lack of health insurance.   Tami Santos reports sudden death in three maternal relatives. Her maternal aunt (II.12) died suddenly at age 75. She reportedly had fluid around her heart but was taking half her prescribed dose due to financial constraints. Her son (III.5) died in his sleep at age 54. He had cardiac disease, the details of which she is not clear. She is aware that he had a  heart murmur and underwent cardiac surgery at age 22/20. Tami Santos states that recently a maternal aunt (II.11), age 38 was found to have CHF and had an ICD implanted. Tami Santos's maternal grandmother (I.4) also died suddenly at age 27. She was in a nursing home and it was suspected that she died of a massive heart attack. Autopsy was not performed to confirm the cause of their deaths.  Genetic Consultation Notes  Tami Santos was counseled on the genetics of hypertrophic cardiomyopathy (HCM), namely its autosomal dominant inheritance. We also talked about incomplete penetrance and variable expression that can be seen in some patients with HCM.   We walked through the process of genetic testing. I explained to her that there are two outcomes of genetic testing for a familial pathogenic variant. Since HCM is autosomal dominant condition, she is at a 50% risk of inheriting the mutation. She verbalized understanding of this.   In addition, we discussed the protections afforded by the Genetic Information Non-Discrimination Act (GINA). I explained to her that GINA protects her from losing her employment or health insurance based on her genotype. However, these protections do not cover life insurance and disability. Tami Santos verbalized understanding of this and informed me that her children have life insurance in place.  Please note that the patient has not been counseled in this visit on personal, cultural or ethical issues that she may face due to her heart condition.   Plan Tami Santos is interested in genetic testing for familial MYH7 pathogenic  variant. Blood was drawn today for testing.   Tami Santos, Ph.D, [facility] Clinical Molecular Geneticist

## 2022-04-15 ENCOUNTER — Telehealth: Payer: Self-pay | Admitting: Cardiology

## 2022-04-15 NOTE — Telephone Encounter (Signed)
Patient states that she has been trying to get in touch with billing to make a payment but keeps getting sent to a VM. Please advise.  ?

## 2022-08-09 ENCOUNTER — Ambulatory Visit: Payer: Medicaid Other | Attending: Genetic Counselor | Admitting: Genetic Counselor

## 2024-04-22 DIAGNOSIS — F1120 Opioid dependence, uncomplicated: Secondary | ICD-10-CM | POA: Insufficient documentation

## 2024-04-23 ENCOUNTER — Ambulatory Visit: Payer: Medicaid Other | Attending: Genetic Counselor | Admitting: Genetic Counselor

## 2024-04-26 NOTE — Progress Notes (Signed)
 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  We reviewed her family pedigree and she notes that she is now 3 months pregnant. No issues were noted with her brother and nephew who are genotype positive, phenotype negative.  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 I explained to her that pathogenic mutations in MYBPC3 gene account for nearly 40% of HCM mutations. This pathogenic variant has been reported in several individuals with HCM and correlates with a benign form the disease with longer survival rates. I explained other that though this variant is associated with a benign outcome, there is a dramatic family history of sudden death at a young age in her maternal relatives and hence it would be prudent for her first-degree relatives to undergo genetic testing for the familial pathogenic variant and seek regular cardiac surveillance.   I reiterated that since this is an autosomal dominant condition, her children are at a 50% risk of inheriting the pathogenic familial variant. It recommended that son, now age 27 seek annual cardiac screening for HCM. I again went through the protections afforded by GINA and explained that it is important her children procure life insurance before undergoing genetic testing.  Please note that the patient has not been counseled in this visit on other personal, cultural or ethical issues that she may face due to her heart condition    Verdis Glade, Ph.D, [facility] Clinical Molecular Geneticist

## 2024-04-30 LAB — PANORAMA PRENATAL TEST FULL PANEL:PANORAMA TEST PLUS 5 ADDITIONAL MICRODELETIONS
FETAL FRACTION: 1.4
REPORT SUMMARY: INCREASED — AB
TRIPLOIDY 13 18 RESULT TEXT: INCREASED — AB

## 2024-05-13 LAB — PANORAMA PRENATAL TEST FULL PANEL:PANORAMA TEST PLUS 5 ADDITIONAL MICRODELETIONS: FETAL FRACTION: 1.6

## 2024-05-20 DIAGNOSIS — Z8249 Family history of ischemic heart disease and other diseases of the circulatory system: Secondary | ICD-10-CM | POA: Insufficient documentation

## 2024-06-17 DIAGNOSIS — L409 Psoriasis, unspecified: Secondary | ICD-10-CM | POA: Insufficient documentation

## 2024-06-17 DIAGNOSIS — E039 Hypothyroidism, unspecified: Secondary | ICD-10-CM | POA: Insufficient documentation

## 2024-06-17 DIAGNOSIS — Z87442 Personal history of urinary calculi: Secondary | ICD-10-CM | POA: Insufficient documentation

## 2024-06-17 DIAGNOSIS — N200 Calculus of kidney: Secondary | ICD-10-CM | POA: Insufficient documentation

## 2024-06-17 DIAGNOSIS — G4700 Insomnia, unspecified: Secondary | ICD-10-CM | POA: Insufficient documentation

## 2024-06-17 DIAGNOSIS — F329 Major depressive disorder, single episode, unspecified: Secondary | ICD-10-CM | POA: Insufficient documentation

## 2024-06-17 DIAGNOSIS — R4589 Other symptoms and signs involving emotional state: Secondary | ICD-10-CM | POA: Insufficient documentation

## 2024-06-17 DIAGNOSIS — E611 Iron deficiency: Secondary | ICD-10-CM | POA: Insufficient documentation

## 2024-07-11 DIAGNOSIS — Z1589 Genetic susceptibility to other disease: Secondary | ICD-10-CM | POA: Insufficient documentation

## 2024-07-12 DIAGNOSIS — O10919 Unspecified pre-existing hypertension complicating pregnancy, unspecified trimester: Secondary | ICD-10-CM | POA: Insufficient documentation

## 2024-07-12 DIAGNOSIS — F1990 Other psychoactive substance use, unspecified, uncomplicated: Secondary | ICD-10-CM | POA: Insufficient documentation

## 2024-07-12 DIAGNOSIS — O99340 Other mental disorders complicating pregnancy, unspecified trimester: Secondary | ICD-10-CM | POA: Insufficient documentation

## 2024-09-06 ENCOUNTER — Ambulatory Visit: Payer: MEDICAID | Admitting: Cardiology

## 2024-09-06 ENCOUNTER — Encounter: Payer: Self-pay | Admitting: Cardiology

## 2024-09-06 VITALS — BP 140/90 | HR 103 | Ht 62.0 in | Wt 201.7 lb

## 2024-09-06 DIAGNOSIS — O133 Gestational [pregnancy-induced] hypertension without significant proteinuria, third trimester: Secondary | ICD-10-CM

## 2024-09-06 DIAGNOSIS — Z3A32 32 weeks gestation of pregnancy: Secondary | ICD-10-CM | POA: Diagnosis not present

## 2024-09-06 MED ORDER — LABETALOL HCL 100 MG PO TABS: 100.0000 mg | ORAL_TABLET | Freq: Two times a day (BID) | ORAL | 3 refills | Status: AC

## 2024-09-06 NOTE — Progress Notes (Signed)
 Cardio-Obstetrics Clinic  New Evaluation  Date:  09/09/2024   ID:  Tami Santos, Tami Santos 06/28/1996, MRN [id number]  PCP:  Renato Dorothey HERO, NP   [facility] Providers Cardiologist:  None  Electrophysiologist:  None       Referring MD: Renato Dorothey HERO, NP   Chief Complaint:  I am ok   History of Present Illness:    Tami Santos is a 28 y.o. female [No obstetric history on file.] who is being seen today for the evaluation of family history of hypertrophic cardiomyopathy at the request of Renato Dorothey HERO, NP.  Discussed the use of AI scribe software for clinical note transcription with the patient, who gave verbal consent to proceed  Medical history includes scoliosis, anxiety, osteoarthritis  who presents for follow-up regarding her high blood pressure.  She is [redacted] weeks pregnant and experienced elevated blood pressure in the early months of pregnancy, which has since stabilized. She is currently taking baby aspirin.   Her family history includes hypertrophic cardiomyopathy in her mom which was also complicated by ventricular tachycardia and her mother is now status post AICD, with her great grandmother having died of a heart attack. 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 She is in the process of testing her seven-year-old child for the gene.  She did see cardiology in 2022 an echocardiogram in 2022 showed no abnormalities.   She monitors her blood pressure at home and denies shortness of breath.   Prior CV Studies Reviewed: The following studies were reviewed today:   Past Medical History:  Diagnosis Date   Anxiety about health    Chest discomfort    History of cardiac murmur as a child    History of kidney stones    Migraines    Osteoarthritis, unspecified osteoarthritis type, unspecified site    Renal calculi    BILATERAL   Scoliosis    TSH (thyroid -stimulating  hormone deficiency)     Past Surgical History:  Procedure Laterality Date   CYST REMOVAL NECK  child   neck/chest area   CYSTOSCOPY WITH RETROGRADE PYELOGRAM, URETEROSCOPY AND STENT PLACEMENT Bilateral 02/27/2017   Procedure: CYSTOSCOPY WITH RETROGRADE PYELOGRAM, URETEROSCOPY, STONE BASKETRY AND STENT PLACEMENT;  Surgeon: Belvie LITTIE Clara, MD;  Location: [facility];  Service: Urology;  Laterality: Bilateral;   EXTRACORPOREAL SHOCK WAVE LITHOTRIPSY Right 09/17/2018   Procedure: RIGHT EXTRACORPOREAL SHOCK WAVE LITHOTRIPSY (ESWL);  Surgeon: Devere Lonni Righter, MD;  Location: [facility];  Service: Urology;  Laterality: Right;   MOUTH SURGERY     2018      OB History   No obstetric history on file.         Current Medications: Current Meds  Medication Sig   acetaminophen  (TYLENOL ) 325 MG tablet Take 650 mg by mouth every 6 (six) hours as needed.   buprenorphine (SUBUTEX) 8 MG SUBL SL tablet Place 8 mg under the tongue 4 (four) times daily.   DICLEGIS 10-10 MG TBEC Take 1 tablet by mouth 2 (two) times daily.   labetalol  (NORMODYNE ) 100 MG tablet Take 1 tablet (100 mg total) by mouth 2 (two) times daily.   ondansetron  (ZOFRAN ) 4 MG tablet Take 4 mg by mouth every 8 (eight) hours as needed for nausea or vomiting.   Prenatal MV-Min-Fe Fum-FA-DHA (PRENATAL+DHA PO) Take 1 tablet by mouth daily.     Allergies:   Patient has no known allergies.  Social History   Socioeconomic History   Marital status: Legally Separated    Spouse name: Not on file   Number of children: Not on file   Years of education: Not on file   Highest education level: Not on file  Occupational History   Not on file  Tobacco Use   Smoking status: Never   Smokeless tobacco: Never  Vaping Use   Vaping status: Never Used  Substance and Sexual Activity   Alcohol use: Yes    Comment: occasionally   Drug use: No   Sexual activity: Not Currently    Birth control/protection: Condom     Comment: SVD 11-28-2017/  BREASTFEEDING  Other Topics Concern   Not on file  Social History Narrative   Not on file   Social Drivers of Health   Financial Resource Strain: Not on file  Food Insecurity: No Food Insecurity (03/25/2024)   Received [facility]   Hunger Vital Sign    Within the past 12 months, you worried that your food would run out before you got the money to buy more.: Never true    Within the past 12 months, the food you bought just didn't last and you didn't have money to get more.: Never true  Transportation Needs: No Transportation Needs (03/25/2024)   Received from [facility]   PRAPARE - Transportation    Lack of Transportation (Medical): No    Lack of Transportation (Non-Medical): No  Physical Activity: Not on file  Stress: Not on file  Social Connections: Unknown (04/26/2022)   Received from [facility]   Social Network    Social Network: Not on file      Family History  Problem Relation Age of Onset   Thyroid  disease Father    Hyperlipidemia Father    Hypertension Paternal Grandfather       ROS:   Please see the history of present illness.     All other systems reviewed and are negative.   Labs/EKG Reviewed:    EKG:   EKG was not ordered today.    Recent Labs: No results found for requested labs within last 365 days.   Recent Lipid Panel No results found for: CHOL, TRIG, HDL, CHOLHDL, LDLCALC, LDLDIRECT  Physical Exam:    VS:  BP (!) 140/90 (BP Location: Left Arm, Patient Position: Sitting, Cuff Size: Normal)   Pulse (!) 103   Ht 5' 2 (1.575 m)   Wt 201 lb 11.2 oz (91.5 kg)   SpO2 97%   BMI 36.89 kg/m     Wt Readings from Last 3 Encounters:  09/06/24 201 lb 11.2 oz (91.5 kg)  10/18/21 116 lb 6.4 oz (52.8 kg)  09/17/18 125 lb 9.6 oz (57 kg)     GEN:  Well nourished, well developed in no acute distress HEENT: Normal NECK: No JVD; No carotid bruits LYMPHATICS: No lymphadenopathy CARDIAC: RRR, no  murmurs, rubs, gallops RESPIRATORY:  Clear to auscultation without rales, wheezing or rhonchi  ABDOMEN: Soft, non-tender, non-distended MUSCULOSKELETAL:  No edema; No deformity  SKIN: Warm and dry NEUROLOGIC:  Alert and oriented x 3 PSYCHIATRIC:  Normal affect    Risk Assessment/Risk Calculators:     CARPREG II Risk Prediction Index Score:  1.  The patient's risk for a primary cardiac event is 5%.   Modified World Health Organization [facility]) Classification of Maternal CV Risk   Class I         ASSESSMENT & PLAN:  Hypertension in pregnancy - with blood pressure reaching 140/90 mmHg. No preeclampsia symptoms. No repeat echocardiogram needed. - Prescribe labetalol  100 mg twice daily. - Schedule follow-up in 1-2 weeks. - Monitor blood pressure at home, keep below 140/90 mmHg. - Contact if blood pressure exceeds 140/90 mmHg or if significant shortness of breath occurs.    Patient Instructions  Medication Instructions:  Your physician has recommended you make the following change in your medication:  START: Labetalol  100 mg twice daily *If you need a refill on your cardiac medications before your next appointment, please call your pharmacy*   Follow-Up: At [facility], you and your health needs are our priority.  As part of our continuing mission to provide you with exceptional heart care, our providers are all part of one team.  This team includes your primary Cardiologist (physician) and Advanced Practice Providers or APPs (Physician Assistants and Nurse Practitioners) who all work together to provide you with the care you need, when you need it.            Dispo:  No follow-ups on file.   Medication Adjustments/Labs and Tests Ordered: Current medicines are reviewed at length with the patient today.  Concerns regarding medicines are outlined above.  Tests Ordered: No orders of the defined types were placed in this encounter.  Medication Changes: Meds  ordered this encounter  Medications   labetalol  (NORMODYNE ) 100 MG tablet    Sig: Take 1 tablet (100 mg total) by mouth 2 (two) times daily.    Dispense:  180 tablet    Refill:  3

## 2024-09-06 NOTE — Patient Instructions (Signed)
 Medication Instructions:  Your physician has recommended you make the following change in your medication:  START: Labetalol  100 mg twice daily *If you need a refill on your cardiac medications before your next appointment, please call your pharmacy*   Follow-Up: At [facility], you and your health needs are our priority.  As part of our continuing mission to provide you with exceptional heart care, our providers are all part of one team.  This team includes your primary Cardiologist (physician) and Advanced Practice Providers or APPs (Physician Assistants and Nurse Practitioners) who all work together to provide you with the care you need, when you need it.

## 2024-09-09 ENCOUNTER — Telehealth: Payer: Self-pay

## 2024-09-09 NOTE — Telephone Encounter (Signed)
 Pt left voicemail stating she was prescribed BP medication at her cardiology appointment but has not taken the medication due her BP being low the last few times she has checked it.  She would like to speak to someone about this.    Waddell, RN

## 2024-09-10 NOTE — Telephone Encounter (Signed)
 RN attempted to call pt to advise her of Dr. Ginette advisement that it is appropriate for her to not take medication until her cardiology this Friday if her blood pressure is less than 130/80.  Left voicemail with office call back number.  MyChart message sent to pt as well.   Waddell, RN     ------------------------------------------   09/09/24  5:58 PM Tobb, Kardie, DO  Ok for her to hold it until her visit on Friday if the bp is is <130/80

## 2024-09-10 NOTE — Telephone Encounter (Signed)
 Advised pt of Dr Princess message.  Pt verbalized understanding with no further questions.   Waddell, RN    --------   09/09/24  5:58 PM Tobb, Kardie, DO  Ok for her to hold it until her visit on Friday if the bp is is <130/80

## 2024-09-13 ENCOUNTER — Ambulatory Visit (INDEPENDENT_AMBULATORY_CARE_PROVIDER_SITE_OTHER): Payer: MEDICAID | Admitting: Pharmacist

## 2024-09-13 ENCOUNTER — Encounter: Payer: Self-pay | Admitting: Pharmacist

## 2024-09-13 VITALS — BP 122/78 | HR 106

## 2024-09-13 DIAGNOSIS — O133 Gestational [pregnancy-induced] hypertension without significant proteinuria, third trimester: Secondary | ICD-10-CM

## 2024-09-13 NOTE — Patient Instructions (Signed)
 SABRA

## 2024-09-13 NOTE — Progress Notes (Signed)
 Patient ID: [name]                 DOB: 1996-11-12                      MRN: [id number]     HPI: Tami Santos is a 28 y.o. female referred to cardio OB clinic.  PMHs significant for anxiety and history of substance abuse.   Presents today with mother to discuss HTN management.  Currently on labetalol  twice daily with no reported adverse effects.  Denies any recent substance abuse.  Brought home Omron cuff and had patient use in room. Reading: 119/89 on home omron cuff   Current HTN meds: Labetalol  100mg  BID   Wt Readings from Last 3 Encounters:  09/06/24 201 lb 11.2 oz (91.5 kg)  10/18/21 116 lb 6.4 oz (52.8 kg)  09/17/18 125 lb 9.6 oz (57 kg)   BP Readings from Last 3 Encounters:  09/06/24 (!) 140/90  10/18/21 123/75  09/17/18 121/70   Pulse Readings from Last 3 Encounters:  09/06/24 (!) 103  10/18/21 74  09/17/18 74    Renal function: CrCl cannot be calculated (No successful lab value found.).  Past Medical History:  Diagnosis Date   Anxiety about health    Chest discomfort    History of cardiac murmur as a child    History of kidney stones    Migraines    Osteoarthritis, unspecified osteoarthritis type, unspecified site    Renal calculi    BILATERAL   Scoliosis    TSH (thyroid -stimulating hormone deficiency)     Current Outpatient Medications on File Prior to Visit  Medication Sig Dispense Refill   acetaminophen  (TYLENOL ) 325 MG tablet Take 650 mg by mouth every 6 (six) hours as needed.     buprenorphine (SUBUTEX) 8 MG SUBL SL tablet Place 8 mg under the tongue 4 (four) times daily.     cetirizine (ZYRTEC) 10 MG tablet Take 10 mg by mouth daily. (Patient not taking: Reported on 09/06/2024)     cholecalciferol (VITAMIN D) 1000 units tablet Take 1,000 Units by mouth daily. (Patient not taking: Reported on 09/06/2024)     Cranberry 405 MG CAPS Take by mouth as directed. (Patient not taking: Reported on 09/06/2024)     DICLEGIS 10-10 MG TBEC Take 1  tablet by mouth 2 (two) times daily.     escitalopram (LEXAPRO) 10 MG tablet Take 1 tablet by mouth daily. (Patient not taking: Reported on 09/06/2024)     ibuprofen (ADVIL) 200 MG tablet Take 200 mg by mouth as needed. (Patient not taking: Reported on 09/06/2024)     labetalol  (NORMODYNE ) 100 MG tablet Take 1 tablet (100 mg total) by mouth 2 (two) times daily. 180 tablet 3   levonorgestrel (MIRENA) 20 MCG/24HR IUD 1 each by Intrauterine route once. (Patient not taking: Reported on 09/06/2024)     loratadine (CLARITIN) 10 MG tablet Take 10 mg by mouth daily. (Patient not taking: Reported on 09/06/2024)     meloxicam (MOBIC) 15 MG tablet Take 15 mg by mouth daily. (Patient not taking: Reported on 09/06/2024)     ondansetron  (ZOFRAN ) 4 MG tablet Take 4 mg by mouth every 8 (eight) hours as needed for nausea or vomiting.     Prenatal MV-Min-Fe Fum-FA-DHA (PRENATAL+DHA PO) Take 1 tablet by mouth daily.     No current facility-administered medications on file prior to visit.    No Known Allergies   Assessment/Plan:  1. Hypertension -  Patient BP controlled in room at 122/78 and 116/73. No med changes needed at this time. F/u as needed.  Continue labetalol  100mg  BID F/u as needed  Chris Guillermo Nehring, PharmD, BCACP, CDCES, CPP [facility] [address] [place], [place] [address] Phone: [phone]; Fax: [phone] 11/26/2024 11:41 AM

## 2024-09-25 ENCOUNTER — Ambulatory Visit: Admitting: Cardiology

## 2024-10-29 NOTE — Discharge Summary (Signed)
°  Admit Date: 10/26/2024  EDD: 11/02/2024, Alternate EDD Entry  Discharge Date and Time: 10/29/24  Discharge to: Home  Discharge Attending Physician: Dionne Piggott Galloway, MD  Delivery Type: Vaginal, Spontaneous  Gestational Age at Delivery:  Information for the patient's newborn:  Pallo, BG   Tami Santos [[id number]]  Gestational Age: [redacted]w[redacted]d  Infant Birth Weight:  3295 g (7 lb 4.2 oz)  Delivery Clinician: EVERLENA ALBESA KAST  Delivery Anesthesia: None;Epidural  Labor Complications: None  Lacerations:    Other Procedures: None  Postpartum Complications: None  Birth Control Planned at Discharge: Review with patient at scheduled postpartum visit:  Infant Feeding Method at Discharge: breast with formula supplementation  Hospital Course: Patient presented for foley bulb on 10/26/24. She had good cervical change and underwent AROM on 11/16 with Pitocin augmentation. She progressed well and went on to deliver vigorous female infant weighing 7 lb 4oz. Her postpartum course was complicated by postpartum fever. She was given IV antibiotics x 24 hours. She had a postpartum hemoglobin of 10.9 down from 11.1. She remained afebrile after cessation of antibiotics. She was asymptomatic for her anemia and deemed stable to be discharged to boarding given infant would require observation for suboxone therapy in pregnancy.   Condition at Discharge: good  Immunizations:  Immunization History  Administered Date(s) Administered   DTaP 09/25/1997, 11/20/1997, 01/21/1998, 10/22/1998   DTaP, Unspecified Formulation 07/17/2002   HPV Quadrivalent (Gardasil) 06/24/2009, 08/25/2009, 12/29/2009   Hepatitis A Vaccine Pediatric / Adolescent 2 Dose IM 06/24/2009, 12/29/2009   Hepatitis B Vaccine, Unspecified Formulation 1997[birth year]07[birth year], 09/25/1997, 01/21/1998   HiB-PRP-OMP 09/25/1997, 11/20/1997, 10/22/1998   INFLUENZA VACCINE IIV3(IM)(PF)6 MOS UP 10/10/2024   Influenza LAIV  (Nasal-Tri) HISTORICAL 08/12/2010   MENINGOCOCCAL VACCINE, A,C,Y, W-135(IM)(MENVEO) 03/20/2014   MMR 07/29/1998, 07/17/2002   Meningococcal Conjugate MCV4P 06/24/2009   Polio Virus Vaccine, Unspecified Formulation 07/17/2002   Poliovirus,inactivated (IPV) 09/25/1997, 11/20/1997, 10/22/1998   TdaP 05/13/2008, 09/11/2016, 08/07/2024   Varicella 07/29/1998, 06/24/2009    Discharge Medications:   Medication List    START taking these medications    acetaminophen  325 MG tablet; Commonly known as: TYLENOL ; Take 2 tablets  (650 mg total) by mouth every six (6) hours as needed.  docusate sodium 100 MG capsule; Commonly known as: COLACE; Take 1  capsule (100 mg total) by mouth two (2) times a day.  ibuprofen 800 MG tablet; Commonly known as: MOTRIN; Take 1 tablet (800  mg total) by mouth every eight (8) hours as needed.   STOP taking these medications    aspirin 81 MG tablet; Commonly known as: ECOTRIN  DICLEGIS 10-10 mg tablet; Generic drug: doxylamine-pyridoxine (vit B6)  multivitamin, prenatal (folic acid-iron) 27-1 mg Tab  ondansetron  4 MG disintegrating tablet; Commonly known as: ZOFRAN -ODT   ASK your doctor about these medications    buprenorphine HCL 8 mg tablet; Commonly known as: SUBUTEX    Discharge Instructions:  Notify MD if temp>100.5, heavy vaginal bleeding more than a pad an hour,  severe nausea or vomiting and unable to tolerate oral intake. Please call the office to arrange for routine postpartum visit in 3 weeks.

## 2024-11-26 ENCOUNTER — Encounter: Payer: Self-pay | Admitting: Pharmacist
